# Patient Record
Sex: Male | Born: 1935 | Race: White | Hispanic: Yes | Smoking: Never smoker
Health system: Southern US, Community
[De-identification: ages and names within clinical notes are randomized; demographics above are authoritative.]

## PROBLEM LIST (undated history)

## (undated) DIAGNOSIS — J449 Chronic obstructive pulmonary disease, unspecified: Secondary | ICD-10-CM

---

## 2017-04-03 ENCOUNTER — Encounter: Payer: Self-pay | Admitting: Emergency Medicine

## 2017-04-03 ENCOUNTER — Inpatient Hospital Stay
Admission: EM | Admit: 2017-04-03 | Discharge: 2017-04-07 | DRG: 871 | Disposition: A | Discharge status: Skilled Nursing Facility | Payer: Medicare Other | Attending: Internal Medicine | Admitting: Internal Medicine

## 2017-04-03 ENCOUNTER — Emergency Department: Payer: Medicare Other

## 2017-04-03 DIAGNOSIS — J9621 Acute and chronic respiratory failure with hypoxia: Secondary | ICD-10-CM

## 2017-04-03 DIAGNOSIS — J440 Chronic obstructive pulmonary disease with acute lower respiratory infection: Secondary | ICD-10-CM | POA: Diagnosis present

## 2017-04-03 DIAGNOSIS — A419 Sepsis, unspecified organism: Secondary | ICD-10-CM | POA: Diagnosis present

## 2017-04-03 DIAGNOSIS — R000 Tachycardia, unspecified: Secondary | ICD-10-CM | POA: Diagnosis present

## 2017-04-03 DIAGNOSIS — D638 Anemia in other chronic diseases classified elsewhere: Secondary | ICD-10-CM | POA: Diagnosis present

## 2017-04-03 DIAGNOSIS — J9620 Acute and chronic respiratory failure, unspecified whether with hypoxia or hypercapnia: Secondary | ICD-10-CM | POA: Diagnosis not present

## 2017-04-03 DIAGNOSIS — J441 Chronic obstructive pulmonary disease with (acute) exacerbation: Secondary | ICD-10-CM

## 2017-04-03 DIAGNOSIS — Z9981 Dependence on supplemental oxygen: Secondary | ICD-10-CM | POA: Diagnosis not present

## 2017-04-03 DIAGNOSIS — Z79899 Other long term (current) drug therapy: Secondary | ICD-10-CM

## 2017-04-03 DIAGNOSIS — E860 Dehydration: Secondary | ICD-10-CM | POA: Diagnosis present

## 2017-04-03 DIAGNOSIS — J189 Pneumonia, unspecified organism: Secondary | ICD-10-CM | POA: Diagnosis present

## 2017-04-03 DIAGNOSIS — Z66 Do not resuscitate: Secondary | ICD-10-CM | POA: Diagnosis present

## 2017-04-03 DIAGNOSIS — J9600 Acute respiratory failure, unspecified whether with hypoxia or hypercapnia: Secondary | ICD-10-CM

## 2017-04-03 DIAGNOSIS — I248 Other forms of acute ischemic heart disease: Secondary | ICD-10-CM | POA: Diagnosis present

## 2017-04-03 HISTORY — DX: Chronic obstructive pulmonary disease, unspecified: J44.9

## 2017-04-03 LAB — TROPONIN I: TROPONIN I: 0.06 ng/mL — AB (ref <0.03)

## 2017-04-03 LAB — CBC WITH DIFFERENTIAL/PLATELET
BASOS ABS: 0 10*3/uL (ref 0–0.1)
BASOS PCT: 0 %
Eosinophils Absolute: 0 10*3/uL (ref 0–0.7)
Eosinophils Relative: 0 %
HEMATOCRIT: 36.2 % — AB (ref 40.0–52.0)
HEMOGLOBIN: 12.3 g/dL — AB (ref 13.0–18.0)
Lymphocytes Relative: 2 %
Lymphs Abs: 0.5 10*3/uL — ABNORMAL LOW (ref 1.0–3.6)
MCH: 30.6 pg (ref 26.0–34.0)
MCHC: 33.9 g/dL (ref 32.0–36.0)
MCV: 90 fL (ref 80.0–100.0)
Monocytes Absolute: 1.4 10*3/uL — ABNORMAL HIGH (ref 0.2–1.0)
Monocytes Relative: 6 %
NEUTROS ABS: 19.9 10*3/uL — AB (ref 1.4–6.5)
NEUTROS PCT: 92 %
Platelets: 165 10*3/uL (ref 150–440)
RBC: 4.02 MIL/uL — ABNORMAL LOW (ref 4.40–5.90)
RDW: 14.3 % (ref 11.5–14.5)
WBC: 21.8 10*3/uL — ABNORMAL HIGH (ref 3.8–10.6)

## 2017-04-03 LAB — BLOOD GAS, VENOUS
Acid-Base Excess: 0.3 mmol/L (ref 0.0–2.0)
BICARBONATE: 26.9 mmol/L (ref 20.0–28.0)
DELIVERY SYSTEMS: POSITIVE
FIO2: 0.4
O2 Saturation: 82 %
PATIENT TEMPERATURE: 37
PH VEN: 7.33 (ref 7.250–7.430)
PO2 VEN: 50 mmHg — AB (ref 32.0–45.0)
pCO2, Ven: 51 mmHg (ref 44.0–60.0)

## 2017-04-03 LAB — MRSA PCR SCREENING: MRSA BY PCR: NEGATIVE

## 2017-04-03 LAB — URINALYSIS, ROUTINE W REFLEX MICROSCOPIC
Bilirubin Urine: NEGATIVE
Glucose, UA: NEGATIVE mg/dL
HGB URINE DIPSTICK: NEGATIVE
Ketones, ur: NEGATIVE mg/dL
Leukocytes, UA: NEGATIVE
Nitrite: NEGATIVE
PROTEIN: NEGATIVE mg/dL
Specific Gravity, Urine: 1.023 (ref 1.005–1.030)
pH: 5 (ref 5.0–8.0)

## 2017-04-03 LAB — COMPREHENSIVE METABOLIC PANEL
ALBUMIN: 3.2 g/dL — AB (ref 3.5–5.0)
ALT: 16 U/L — ABNORMAL LOW (ref 17–63)
ANION GAP: 7 (ref 5–15)
AST: 19 U/L (ref 15–41)
Alkaline Phosphatase: 108 U/L (ref 38–126)
BILIRUBIN TOTAL: 1.6 mg/dL — AB (ref 0.3–1.2)
BUN: 21 mg/dL — ABNORMAL HIGH (ref 6–20)
CO2: 26 mmol/L (ref 22–32)
Calcium: 8.6 mg/dL — ABNORMAL LOW (ref 8.9–10.3)
Chloride: 101 mmol/L (ref 101–111)
Creatinine, Ser: 1.03 mg/dL (ref 0.61–1.24)
GFR calc Af Amer: >60 mL/min (ref >60)
Glucose, Bld: 144 mg/dL — ABNORMAL HIGH (ref 65–99)
POTASSIUM: 4.4 mmol/L (ref 3.5–5.1)
Sodium: 134 mmol/L — ABNORMAL LOW (ref 135–145)
TOTAL PROTEIN: 7.1 g/dL (ref 6.5–8.1)

## 2017-04-03 LAB — CREATININE, SERUM
Creatinine, Ser: 0.92 mg/dL (ref 0.61–1.24)
GFR calc Af Amer: >60 mL/min (ref >60)
GFR calc non Af Amer: >60 mL/min (ref >60)

## 2017-04-03 LAB — MAGNESIUM: Magnesium: 2.1 mg/dL (ref 1.7–2.4)

## 2017-04-03 LAB — PROCALCITONIN: PROCALCITONIN: 0.92 ng/mL

## 2017-04-03 LAB — PROTIME-INR
INR: 1.31
Prothrombin Time: 16.4 seconds — ABNORMAL HIGH (ref 11.4–15.2)

## 2017-04-03 LAB — GLUCOSE, CAPILLARY: Glucose-Capillary: 164 mg/dL — ABNORMAL HIGH (ref 65–99)

## 2017-04-03 LAB — LACTIC ACID, PLASMA: LACTIC ACID, VENOUS: 1.3 mmol/L (ref 0.5–1.9)

## 2017-04-03 MED ORDER — SODIUM CHLORIDE 0.9 % IV SOLN
Freq: Once | INTRAVENOUS | Status: AC
  Administered 2017-04-03: 13:00:00 via INTRAVENOUS

## 2017-04-03 MED ORDER — PIPERACILLIN-TAZOBACTAM 3.375 G IVPB 30 MIN
INTRAVENOUS | Status: AC
  Administered 2017-04-03: 3.375 g via INTRAVENOUS
  Filled 2017-04-03: qty 50

## 2017-04-03 MED ORDER — LEVALBUTEROL HCL 1.25 MG/0.5ML IN NEBU
1.2500 mg | INHALATION_SOLUTION | Freq: Four times a day (QID) | RESPIRATORY_TRACT | Status: DC
  Filled 2017-04-03: qty 0.5

## 2017-04-03 MED ORDER — SODIUM CHLORIDE 0.9% FLUSH
3.0000 mL | INTRAVENOUS | Status: DC | PRN
  Administered 2017-04-05: 3 mL via INTRAVENOUS
  Filled 2017-04-03: qty 3

## 2017-04-03 MED ORDER — IPRATROPIUM-ALBUTEROL 0.5-2.5 (3) MG/3ML IN SOLN
3.0000 mL | Freq: Once | RESPIRATORY_TRACT | Status: AC
  Administered 2017-04-03: 3 mL via RESPIRATORY_TRACT
  Filled 2017-04-03: qty 6

## 2017-04-03 MED ORDER — IPRATROPIUM-ALBUTEROL 0.5-2.5 (3) MG/3ML IN SOLN
3.0000 mL | RESPIRATORY_TRACT | Status: DC
  Administered 2017-04-03 – 2017-04-07 (×21): 3 mL via RESPIRATORY_TRACT
  Filled 2017-04-03 (×21): qty 3

## 2017-04-03 MED ORDER — PIPERACILLIN-TAZOBACTAM 3.375 G IVPB
3.3750 g | Freq: Three times a day (TID) | INTRAVENOUS | Status: DC
  Administered 2017-04-03 – 2017-04-06 (×7): 3.375 g via INTRAVENOUS
  Filled 2017-04-03 (×9): qty 50

## 2017-04-03 MED ORDER — PIPERACILLIN-TAZOBACTAM 3.375 G IVPB 30 MIN
3.3750 g | Freq: Once | INTRAVENOUS | Status: AC
Start: 2017-04-03 — End: 2017-04-03
  Administered 2017-04-03: 3.375 g via INTRAVENOUS

## 2017-04-03 MED ORDER — THEOPHYLLINE ER 300 MG PO TB12
150.0000 mg | ORAL_TABLET | Freq: Two times a day (BID) | ORAL | Status: DC
  Administered 2017-04-04 – 2017-04-07 (×7): 150 mg via ORAL
  Filled 2017-04-03 (×9): qty 1

## 2017-04-03 MED ORDER — TERAZOSIN HCL 5 MG PO CAPS
10.0000 mg | ORAL_CAPSULE | Freq: Every day | ORAL | Status: DC
  Administered 2017-04-03 – 2017-04-06 (×4): 10 mg via ORAL
  Filled 2017-04-03 (×5): qty 2

## 2017-04-03 MED ORDER — ALPRAZOLAM 0.5 MG PO TABS
0.2500 mg | ORAL_TABLET | Freq: Three times a day (TID) | ORAL | Status: DC | PRN
  Administered 2017-04-05 – 2017-04-06 (×4): 0.25 mg via ORAL
  Filled 2017-04-03 (×5): qty 1

## 2017-04-03 MED ORDER — BISACODYL 5 MG PO TBEC: 5.0000 mg | DELAYED_RELEASE_TABLET | Freq: Every day | ORAL | Status: DC | PRN

## 2017-04-03 MED ORDER — METHYLPREDNISOLONE SODIUM SUCC 40 MG IJ SOLR
40.0000 mg | Freq: Two times a day (BID) | INTRAMUSCULAR | Status: DC
  Administered 2017-04-03 – 2017-04-07 (×8): 40 mg via INTRAVENOUS
  Filled 2017-04-03 (×8): qty 1

## 2017-04-03 MED ORDER — SODIUM CHLORIDE 0.9 % IV BOLUS (SEPSIS)
1000.0000 mL | Freq: Once | INTRAVENOUS | Status: AC
  Administered 2017-04-03: 1000 mL via INTRAVENOUS

## 2017-04-03 MED ORDER — HYDROCODONE-ACETAMINOPHEN 5-325 MG PO TABS: 1.0000 | ORAL_TABLET | ORAL | Status: DC | PRN

## 2017-04-03 MED ORDER — IPRATROPIUM-ALBUTEROL 0.5-2.5 (3) MG/3ML IN SOLN
3.0000 mL | Freq: Once | RESPIRATORY_TRACT | Status: AC
  Administered 2017-04-03: 3 mL via RESPIRATORY_TRACT

## 2017-04-03 MED ORDER — ALBUTEROL SULFATE (2.5 MG/3ML) 0.083% IN NEBU: 2.5000 mg | INHALATION_SOLUTION | RESPIRATORY_TRACT | Status: DC | PRN

## 2017-04-03 MED ORDER — MOMETASONE FUROATE 220 MCG/INH IN AEPB
2.0000 | INHALATION_SPRAY | Freq: Every day | RESPIRATORY_TRACT | Status: DC
Start: 2017-04-03 — End: 2017-04-03

## 2017-04-03 MED ORDER — BUDESONIDE 0.5 MG/2ML IN SUSP
0.5000 mg | Freq: Two times a day (BID) | RESPIRATORY_TRACT | Status: DC
  Administered 2017-04-03 – 2017-04-07 (×8): 0.5 mg via RESPIRATORY_TRACT
  Filled 2017-04-03 (×8): qty 2

## 2017-04-03 MED ORDER — SODIUM CHLORIDE 0.9 % IV SOLN
250.0000 mL | INTRAVENOUS | Status: DC | PRN
  Administered 2017-04-05: 250 mL via INTRAVENOUS

## 2017-04-03 MED ORDER — SENNOSIDES-DOCUSATE SODIUM 8.6-50 MG PO TABS: 1.0000 | ORAL_TABLET | Freq: Every evening | ORAL | Status: DC | PRN

## 2017-04-03 MED ORDER — ACETAMINOPHEN 325 MG PO TABS: 650.0000 mg | ORAL_TABLET | Freq: Four times a day (QID) | ORAL | Status: DC | PRN

## 2017-04-03 MED ORDER — TIOTROPIUM BROMIDE MONOHYDRATE 18 MCG IN CAPS
18.0000 ug | ORAL_CAPSULE | Freq: Every day | RESPIRATORY_TRACT | Status: DC
  Administered 2017-04-04 – 2017-04-06 (×3): 18 ug via RESPIRATORY_TRACT
  Filled 2017-04-03: qty 5

## 2017-04-03 MED ORDER — MORPHINE SULFATE 10 MG/5ML PO SOLN
5.0000 mg | Freq: Four times a day (QID) | ORAL | Status: DC | PRN
  Administered 2017-04-05 – 2017-04-06 (×5): 5 mg via ORAL
  Filled 2017-04-03 (×5): qty 5

## 2017-04-03 MED ORDER — ARFORMOTEROL TARTRATE 15 MCG/2ML IN NEBU
15.0000 ug | INHALATION_SOLUTION | Freq: Two times a day (BID) | RESPIRATORY_TRACT | Status: DC
  Administered 2017-04-03 – 2017-04-07 (×7): 15 ug via RESPIRATORY_TRACT
  Filled 2017-04-03 (×9): qty 2

## 2017-04-03 MED ORDER — ONDANSETRON HCL 4 MG/2ML IJ SOLN: 4.0000 mg | Freq: Four times a day (QID) | INTRAMUSCULAR | Status: DC | PRN

## 2017-04-03 MED ORDER — VITAMIN B-12 1000 MCG PO TABS
1000.0000 ug | ORAL_TABLET | Freq: Every day | ORAL | Status: DC
  Administered 2017-04-04 – 2017-04-07 (×4): 1000 ug via ORAL
  Filled 2017-04-03 (×4): qty 1

## 2017-04-03 MED ORDER — ACETAMINOPHEN 650 MG RE SUPP: 650.0000 mg | Freq: Four times a day (QID) | RECTAL | Status: DC | PRN

## 2017-04-03 MED ORDER — TIOTROPIUM BROMIDE-OLODATEROL 2.5-2.5 MCG/ACT IN AERS: INHALATION_SPRAY | Freq: Every day | RESPIRATORY_TRACT | Status: DC

## 2017-04-03 MED ORDER — SENNOSIDES-DOCUSATE SODIUM 8.6-50 MG PO TABS
1.0000 | ORAL_TABLET | Freq: Two times a day (BID) | ORAL | Status: DC
  Administered 2017-04-03 – 2017-04-07 (×7): 1 via ORAL
  Filled 2017-04-03 (×7): qty 1

## 2017-04-03 MED ORDER — HEPARIN SODIUM (PORCINE) 5000 UNIT/ML IJ SOLN
5000.0000 [IU] | Freq: Three times a day (TID) | INTRAMUSCULAR | Status: DC
  Administered 2017-04-03 – 2017-04-07 (×12): 5000 [IU] via SUBCUTANEOUS
  Filled 2017-04-03 (×13): qty 1

## 2017-04-03 MED ORDER — FLUOXETINE HCL 20 MG PO CAPS
40.0000 mg | ORAL_CAPSULE | Freq: Every day | ORAL | Status: DC
  Administered 2017-04-04 – 2017-04-07 (×4): 40 mg via ORAL
  Filled 2017-04-03 (×5): qty 2

## 2017-04-03 MED ORDER — MORPHINE SULFATE 20 MG/5ML PO SOLN: 5.0000 mg | Freq: Four times a day (QID) | ORAL | Status: DC

## 2017-04-03 MED ORDER — FINASTERIDE 5 MG PO TABS
5.0000 mg | ORAL_TABLET | Freq: Every day | ORAL | Status: DC
  Administered 2017-04-04 – 2017-04-07 (×4): 5 mg via ORAL
  Filled 2017-04-03 (×4): qty 1

## 2017-04-03 MED ORDER — VANCOMYCIN HCL IN DEXTROSE 1-5 GM/200ML-% IV SOLN
INTRAVENOUS | Status: AC
  Administered 2017-04-03: 1000 mg via INTRAVENOUS
  Filled 2017-04-03: qty 200

## 2017-04-03 MED ORDER — VANCOMYCIN HCL IN DEXTROSE 1-5 GM/200ML-% IV SOLN
1000.0000 mg | INTRAVENOUS | Status: DC
  Administered 2017-04-03: 1000 mg via INTRAVENOUS
  Filled 2017-04-03 (×2): qty 200

## 2017-04-03 MED ORDER — HYDROXYZINE HCL 10 MG PO TABS
10.0000 mg | ORAL_TABLET | Freq: Four times a day (QID) | ORAL | Status: DC | PRN
  Administered 2017-04-06: 10 mg via ORAL
  Filled 2017-04-03 (×2): qty 1

## 2017-04-03 MED ORDER — SODIUM CHLORIDE 0.9% FLUSH
3.0000 mL | Freq: Two times a day (BID) | INTRAVENOUS | Status: DC
  Administered 2017-04-03 – 2017-04-07 (×7): 3 mL via INTRAVENOUS

## 2017-04-03 MED ORDER — METHYLPREDNISOLONE SODIUM SUCC 125 MG IJ SOLR: 60.0000 mg | Freq: Four times a day (QID) | INTRAMUSCULAR | Status: DC

## 2017-04-03 MED ORDER — VANCOMYCIN HCL IN DEXTROSE 1-5 GM/200ML-% IV SOLN
1000.0000 mg | Freq: Once | INTRAVENOUS | Status: AC
  Administered 2017-04-03: 1000 mg via INTRAVENOUS

## 2017-04-03 MED ORDER — NORTRIPTYLINE HCL 10 MG PO CAPS
20.0000 mg | ORAL_CAPSULE | Freq: Every day | ORAL | Status: DC
  Administered 2017-04-03 – 2017-04-06 (×4): 20 mg via ORAL
  Filled 2017-04-03 (×5): qty 2

## 2017-04-03 MED ORDER — ONDANSETRON HCL 4 MG PO TABS: 4.0000 mg | ORAL_TABLET | Freq: Four times a day (QID) | ORAL | Status: DC | PRN

## 2017-04-03 MED ORDER — PRIMIDONE 50 MG PO TABS
50.0000 mg | ORAL_TABLET | Freq: Two times a day (BID) | ORAL | Status: DC
  Administered 2017-04-03 – 2017-04-07 (×8): 50 mg via ORAL
  Filled 2017-04-03 (×9): qty 1

## 2017-04-03 NOTE — Progress Notes (Signed)
Patient on 3 liter nasal cannula tolerating well. bipap on standby

## 2017-04-03 NOTE — ED Notes (Signed)
[facility]  [facility]

## 2017-04-03 NOTE — ED Triage Notes (Signed)
Patient presents to ED via [facility] from [facility] with c/o SOB. Patient with auditory wheezes. End stage COPD, hospice care. Full code at this time. EMS report temp of 100, HR 120. EMS gave 1 duoneb treatment and 125 of solumedrol. Patient wears 3L [place] chronically. SpO2 91% on 3L.

## 2017-04-03 NOTE — H&P (Addendum)
[employer] - [place] at [facility]   PATIENT NAME: Johnny Cisneros    MR#:  [id number]  DATE OF BIRTH:  December 20, 1930  DATE OF ADMISSION:  04/03/2017  PRIMARY CARE PHYSICIAN: No primary care provider on file.   REQUESTING/REFERRING PHYSICIAN: Emily Filbert, MD  CHIEF COMPLAINT:   Chief Complaint  Patient presents with  . Code Sepsis   Shortness of breath, wheezing and cough for 3 days. HISTORY OF PRESENT ILLNESS:  Johnny Cisneros  is a 83 y.o. male with a known history of End-stage COPD, chronic respiratory failure on home oxygen 3 L and on hospice care at [facility]. The patient presently ED with the above chief complaint. She was found hypoxia, tachycardia and tachypnea, as well as leukocytosis. Chest x-ray didn't show any infiltrate. He is put on BiPAP. He is treated with DuoNeb and IV Solu-Medrol with mild improvement.  PAST MEDICAL HISTORY:   Past Medical History:  Diagnosis Date  . COPD (chronic obstructive pulmonary disease) (HCC)     PAST SURGICAL HISTORY:  History reviewed. No pertinent surgical history.  SOCIAL HISTORY:   Social History  Substance Use Topics  . Smoking status: Never Smoker  . Smokeless tobacco: Never Used  . Alcohol use No    FAMILY HISTORY:  No family history on file. The patient doesn't know his family history.  DRUG ALLERGIES:  No Known Allergies  REVIEW OF SYSTEMS:   Review of Systems  Constitutional: Positive for malaise/fatigue. Negative for chills and fever.  HENT: Negative for sore throat.   Eyes: Negative for blurred vision and double vision.  Respiratory: Positive for cough, sputum production, shortness of breath and wheezing. Negative for hemoptysis and stridor.   Cardiovascular: Negative for chest pain, palpitations, orthopnea and leg swelling.  Gastrointestinal: Negative for abdominal pain, blood in stool, diarrhea, melena, nausea and vomiting.  Genitourinary: Negative for dysuria, flank pain  and hematuria.  Musculoskeletal: Negative for back pain and joint pain.  Neurological: Negative for dizziness, sensory change, focal weakness, seizures, loss of consciousness, weakness and headaches.  Endo/Heme/Allergies: Negative for polydipsia.  Psychiatric/Behavioral: Negative for depression. The patient is not nervous/anxious.     MEDICATIONS AT HOME:   Prior to Admission medications   Medication Sig Start Date End Date Taking? Authorizing Provider  albuterol (PROVENTIL) (2.5 MG/3ML) 0.083% nebulizer solution Take 2.5 mg by nebulization every 6 (six) hours as needed for wheezing or shortness of breath.   Yes [provider]  ALPRAZolam (XANAX) 0.25 MG tablet Take 0.25 mg by mouth 3 (three) times daily as needed for anxiety.   Yes [provider]  ammonium lactate (LAC-HYDRIN) 12 % lotion Apply 1 application topically 2 (two) times daily.   Yes [provider]  finasteride (PROSCAR) 5 MG tablet Take 5 mg by mouth daily.   Yes [provider]  FLUoxetine (PROZAC) 40 MG capsule Take 40 mg by mouth daily.   Yes [provider]  hydrOXYzine (ATARAX/VISTARIL) 10 MG tablet Take 10 mg by mouth 4 (four) times daily as needed for itching.   Yes [provider]  mometasone (ASMANEX) 220 MCG/INH inhaler Inhale 2 puffs into the lungs at bedtime.   Yes [provider]  morphine 20 MG/5ML solution Take 5 mg by mouth 4 (four) times daily. For breathing.   Yes [provider]  nortriptyline (PAMELOR) 10 MG capsule Take 20 mg by mouth at bedtime.   Yes [provider]  primidone (MYSOLINE) 50 MG tablet Take 50  mg by mouth 2 (two) times daily.   Yes [provider]  sennosides-docusate sodium (SENOKOT-S) 8.6-50 MG tablet Take 1 tablet by mouth 2 (two) times daily.   Yes [provider]  terazosin (HYTRIN) 10 MG capsule Take 10 mg by mouth at bedtime.   Yes [provider]  theophylline (THEO-24) 300 MG  24 hr capsule Take 150 mg by mouth 2 (two) times daily.   Yes [provider]  Tiotropium Bromide-Olodaterol (STIOLTO RESPIMAT IN) Inhale 2 puffs into the lungs daily.   Yes [provider]  vitamin B-12 (CYANOCOBALAMIN) 1000 MCG tablet Take 1,000 mcg by mouth daily.   Yes [provider]      VITAL SIGNS:  Blood pressure 126/68, pulse (!) 101, temperature 98.8 F (37.1 C), resp. rate (!) 31, height 5\' 7"  (1.702 m), weight 139 lb (63 kg), SpO2 96 %.  PHYSICAL EXAMINATION:  Physical Exam  GENERAL:  83 y.o.-year-old patient lying in the bed with BIPAP. EYES: Pupils equal, round, reactive to light and accommodation. No scleral icterus. Extraocular muscles intact.  HEENT: Head atraumatic, normocephalic.  NECK:  Supple, no jugular venous distention. No thyroid enlargement, no tenderness.  LUNGS: Very diminished breath sounds bilaterally, no wheezing, rales,rhonchi or crepitation. No use of accessory muscles of respiration.  CARDIOVASCULAR: S1, S2 normal. No murmurs, rubs, or gallops.  ABDOMEN: Soft, nontender, nondistended. Bowel sounds present. No organomegaly or mass.  EXTREMITIES: No pedal edema, cyanosis, or clubbing.  NEUROLOGIC: Cranial nerves II through XII are intact. Muscle strength 5/5 in all extremities. Sensation intact. Gait not checked.  PSYCHIATRIC: The patient is alert and oriented x 3.  SKIN: No obvious rash, lesion, or ulcer.   LABORATORY PANEL:   CBC  Recent Labs Lab 04/03/17 1202  WBC 21.8*  HGB 12.3*  HCT 36.2*  PLT 165   ------------------------------------------------------------------------------------------------------------------  Chemistries   Recent Labs Lab 04/03/17 1202  NA 134*  K 4.4  CL 101  CO2 26  GLUCOSE 144*  BUN 21*  CREATININE 1.03  CALCIUM 8.6*  AST 19  ALT 16*  ALKPHOS 108  BILITOT 1.6*    ------------------------------------------------------------------------------------------------------------------  Cardiac Enzymes  Recent Labs Lab 04/03/17 1206  TROPONINI 0.06*   ------------------------------------------------------------------------------------------------------------------  RADIOLOGY:  Dg Chest Port 1 View  Result Date: 04/03/2017 CLINICAL DATA:  Shortness of breath.  Wheezing. EXAM: PORTABLE CHEST 1 VIEW COMPARISON:  No prior . FINDINGS: Mediastinum hilar structures are normal. Heart size normal. Bilateral interstitial prominence noted. These changes could be chronic. Active interstitial process including pneumonitis and interstitial edema cannot be excluded. COPD . Bibasilar atelectasis . No pleural effusion or pneumothorax. IMPRESSION: 1. Bilateral interstitial prominence, these changes may be chronic. An active interstitial process such as interstitial pneumonitis or interstitial edema cannot be excluded. 2. COPD. Bibasilar atelectasis. Follow-up chest x-ray may prove useful for continued evaluation. Electronically Signed   By: Maisie Fus  Register   On: 04/03/2017 12:21      IMPRESSION AND PLAN:   Acute on chronic respiratory failure with hypoxia due to COPD exacerbation.. The patient will be admitted to stepdown unit. Continue BiPAP. Xopenex every 6 hours, IV Solu-Medrol every 6 hours. Intensivist consult.  Sepsis. Unclear etiology. Continue IV antibiotics and follow-up CBC and cultures. Elevated troponin. Possible due to demanding ischemia, follow-up troponin level. Aspirin. Dehydration. IV fluid support.  Discussed with ICU nurse practitioner.  All the records are reviewed and case discussed with ED provider. Management plans discussed with the patient, family and they are in agreement.  CODE STATUS: Full code  TOTAL CRITICAL TIME TAKING CARE OF THIS PATIENT: 60 minutes.    Shaune Pollackhen, Rozann Holts M.D on 04/03/2017 at 3:00 PM  Between 7am to 6pm - Pager -  [phone]  After 6pm go to [email or web address] - Scientist, research (life sciences)password [email or web address] [email or web address]  [facility] [place] [facility]  Office  [phone]  CC: Primary care physician; No primary care provider on file.   Note: This dictation was prepared with Dragon dictation along with smaller phrase technology. Any transcriptional errors that result from this process are unintentional.

## 2017-04-03 NOTE — ED Provider Notes (Signed)
[facility] Emergency Department Provider Note       Time seen: ----------------------------------------- 12:12 PM on 04/03/2017 -----------------------------------------     I have reviewed the triage vital signs and the nursing notes.   HISTORY   Chief Complaint Code Sepsis    HPI Johnny Cisneros is a 83 y.o. male who presents to the ED for shortness of breath. Patient arrives via EMS from [facility] with difficulty breathing and fever with cough and congestion. Patient presents with wheezing, report he has end-stage COPD and is on hospice care. He currently is a full code. EMS reported a temperature around the 100. He presents tachypnea can tachycardic. He received DuoNeb and Solu-Medrol prior to arrival with mild improvement. He currently wears 3 L of nasal cannula oxygen all the time.    Past Medical History:  Diagnosis Date  . COPD (chronic obstructive pulmonary disease) (HCC)     There are no active problems to display for this patient.   History reviewed. No pertinent surgical history.  Allergies Patient has no known allergies.  Social History Social History  Substance Use Topics  . Smoking status: Never Smoker  . Smokeless tobacco: Never Used  . Alcohol use No    Review of Systems Constitutional: Positive for fever Eyes: Negative for vision changes ENT:  Negative for congestion, sore throat Cardiovascular: Negative for chest pain. Respiratory: Positive for cough and shortness of breath Gastrointestinal: Negative for abdominal pain, vomiting and diarrhea. Genitourinary: Negative for dysuria. Musculoskeletal: Negative for back pain. Skin: Negative for rash. Neurological: Negative for headaches, focal weakness or numbness.  All systems negative/normal/unremarkable except as stated in the HPI  ____________________________________________   PHYSICAL EXAM:  VITAL SIGNS: ED Triage Vitals  Enc Vitals Group     BP --      Pulse --      Resp --      Temp 04/03/17 1159 98.8 F (37.1 C)     Temp src --      SpO2 04/03/17 1207 90 %     Weight 04/03/17 1200 139 lb (63 kg)     Height 04/03/17 1200 5\' 7"  (1.702 m)     Head Circumference --      Peak Flow --      Pain Score --      Pain Loc --      Pain Edu? --      Excl. in GC? --     Constitutional: Alert and oriented. Mild to moderate distress Eyes: Conjunctivae are normal. Normal extraocular movements. ENT   Head: Normocephalic and atraumatic.   Nose: No congestion/rhinnorhea.   Mouth/Throat: Mucous membranes are moist.   Neck: No stridor. Cardiovascular: Rapid rate, regular rhythm. No murmurs, rubs, or gallops. Respiratory: Tachypnea with wheezing bilaterally, prolonged expirations Gastrointestinal: Soft and nontender. Normal bowel sounds Musculoskeletal: Nontender with normal range of motion in extremities. Minimal edema is noted Neurologic:  Normal speech and language. No gross focal neurologic deficits are appreciated.  Skin:  Skin is warm, dry and intact. No rash noted. Psychiatric: Mood and affect are normal. Speech and behavior are normal.  ____________________________________________  EKG: Interpreted by me. Sinus tachycardia with rate 120 bpm, normal PR interval, normal QRS width, normal QT, nonspecific ST changes.  Repeat EKG interpreted by me, sinus tachycardia with a rate of 118 bpm, normal PR interval, normal QRS width, normal QT, ST segment changes   ____________________________________________  ED COURSE:  Pertinent labs & imaging results that were available during  my care of the patient were reviewed by me and considered in my medical decision making (see chart for details). Patient presents for dyspnea, we will assess with labs and imaging as indicated.Patient arrives critically ill  Clinical Course as of Apr 03 1245  Fri Apr 03, 2017  1238 Patient presents not improvement after 2 DuoNeb. We will place him on  BiPAP.  [JW]    Clinical Course User Index [JW] Emily FilbertWilliams, Jonathan E, MD   Procedures ____________________________________________   LABS (pertinent positives/negatives)  Labs Reviewed  COMPREHENSIVE METABOLIC PANEL - Abnormal; Notable for the following:       Result Value   Sodium 134 (*)    Glucose, Bld 144 (*)    BUN 21 (*)    Calcium 8.6 (*)    Albumin 3.2 (*)    ALT 16 (*)    Total Bilirubin 1.6 (*)    All other components within normal limits  CBC WITH DIFFERENTIAL/PLATELET - Abnormal; Notable for the following:    WBC 21.8 (*)    RBC 4.02 (*)    Hemoglobin 12.3 (*)    HCT 36.2 (*)    Neutro Abs 19.9 (*)    Lymphs Abs 0.5 (*)    Monocytes Absolute 1.4 (*)    All other components within normal limits  BLOOD GAS, VENOUS - Abnormal; Notable for the following:    pO2, Ven 50.0 (*)    All other components within normal limits  CULTURE, BLOOD (ROUTINE X 2)  CULTURE, BLOOD (ROUTINE X 2)  URINE CULTURE  LACTIC ACID, PLASMA  PROTIME-INR  URINALYSIS, ROUTINE W REFLEX MICROSCOPIC   CRITICAL CARE Performed by: Emily FilbertWilliams, Jonathan E   Total critical care time: 30 minutes  Critical care time was exclusive of separately billable procedures and treating other patients.  Critical care was necessary to treat or prevent imminent or life-threatening deterioration.  Critical care was time spent personally by me on the following activities: development of treatment plan with patient and/or surrogate as well as nursing, discussions with consultants, evaluation of patient's response to treatment, examination of patient, obtaining history from patient or surrogate, ordering and performing treatments and interventions, ordering and review of laboratory studies, ordering and review of radiographic studies, pulse oximetry and re-evaluation of patient's condition.  RADIOLOGY Images were viewed by me  Chest x-ray  IMPRESSION: 1. Bilateral interstitial prominence, these changes may be  chronic. An active interstitial process such as interstitial pneumonitis or interstitial edema cannot be excluded.  2. COPD. Bibasilar atelectasis. Follow-up chest x-ray may prove useful for continued evaluation. ____________________________________________  FINAL ASSESSMENT AND PLAN  Acute respiratory distress, COPD exacerbation, Possible pneumonia    Plan: Patient's labs and imaging were dictated above. Patient had presented for respiratory distress and did not improve after multiple DuoNeb since after artery having received a DuoNeb and steroids in route. He does have end-stage COPD and is much improved on BiPAP. I have covered for sepsis with vancomycin and Zosyn due to his high white count. I will discuss with the hospitalist for admission.   Emily FilbertWilliams, Jonathan E, MD   Note: This note was generated in part or whole with voice recognition software. Voice recognition is usually quite accurate but there are transcription errors that can and very often do occur. I apologize for any typographical errors that were not detected and corrected.     Emily FilbertWilliams, Jonathan E, MD 04/03/17 1325

## 2017-04-03 NOTE — Progress Notes (Signed)
Pharmacy Antibiotic Note  Johnny Cisneros is a 83 y.o. male admitted on 04/03/2017 with sepsis.  Pharmacy has been consulted for vancomycin and piperacillin/tazobactam dosing.  Plan: Piperacillin/tazobactam 3.375 g IV q8h EI  Vancomycin 1000 mg dose given in ED.  Will order vancomycin 1000 mg IV q24h (7 hour stacked dose) Goal VT 15-20 mcg/mL VT 7/23 @ 1930  Kinetics: Using actual body weight = 63 kg, CrCl = 49 mL/min Ke: 0.045 Half-life: 15 hrs Vd: = 44 L Cmin (estimate) = 15 mcg/mL  Height: 5\' 7"  (170.2 cm) Weight: 139 lb (63 kg) IBW/kg (Calculated) : 66.1  Temp (24hrs), Avg:98.8 F (37.1 C), Min:98.8 F (37.1 C), Max:98.8 F (37.1 C)   Recent Labs Lab 04/03/17 1202  WBC 21.8*  CREATININE 1.03  LATICACIDVEN 1.3    Estimated Creatinine Clearance: 48.5 mL/min (by C-G formula based on SCr of 1.03 mg/dL).    No Known Allergies  Antimicrobials this admission: Piperacillin/tazobactam 7/20 >>  vancomycin 7/20 >>   Dose adjustments this admission:  Microbiology results: 7/20 BCx: Sent 7/20 UCx: Sent  7/20 MRSA PCR: Sent  Thank you for allowing pharmacy to be a part of this patient's care.  Cindi CarbonMary M Haseeb Fiallos, PharmD, BCPS Clinical Pharmacist 04/03/2017 4:56 PM

## 2017-04-03 NOTE — ED Notes (Signed)
Upon entering the room patient has taken off his BiPAP. SpO2 82%. Patient educated on the importance of keeping mask on. Patient verbalizes understanding. Mask reapplied, SpO2 95% on Bipap.

## 2017-04-03 NOTE — ED Notes (Signed)
Informed RN bed ready  1610

## 2017-04-03 NOTE — ED Notes (Signed)
Amy from hospice called and wanted to make sure staff was aware that patient is a hospice patient but is a full code at this time. RN from hospice also requesting his morphine that is ordered (5mg  Q6 hours) be ordered more frequently if patient will be discharged back to facility.

## 2017-04-03 NOTE — Consult Note (Signed)
Name: Johnny Cisneros MRN: [id number] DOB: 11/04/1930    ADMISSION DATE:  04/03/2017 CONSULTATION DATE: 04/03/2017  REFERRING MD : Dr. Imogene Burnhen   CHIEF COMPLAINT: Shortness of Breath   BRIEF PATIENT DESCRIPTION:  83 yo male admitted 07/20 with acute on chronic hypoxic respiratory failure secondary to AECOPD and bilateral pneumonia requiring continuous Bipap   SIGNIFICANT EVENTS  07/20-Pt admitted to the Stepdown Unit   STUDIES:  None   HISTORY OF PRESENT ILLNESS:   This is an 83 yo male with a PMH of End Stage COPD on chronic home O2 @3L  followed by hospice.  [facility] with shortness of breath, fever, cough, and congestion.  Per EMS arrival he was febrile with a temp of 100 F, tachycardic, and tachypneic.  Therefore, en route to the ER he received duoneb treatment and solumedrol with mild improvement of symptoms on 3L O2 via nasal canula.  In the ER pt remained hypoxic with O2 sats 82%, therefore he was placed on continuous Bipap.  [facility] team for further workup and treatment PCCM consulted.  PAST MEDICAL HISTORY :   has a past medical history of COPD (chronic obstructive pulmonary disease) (HCC).  has no past surgical history on file. Prior to Admission medications   Medication Sig Start Date End Date Taking? Authorizing Provider  albuterol (PROVENTIL) (2.5 MG/3ML) 0.083% nebulizer solution Take 2.5 mg by nebulization every 6 (six) hours as needed for wheezing or shortness of breath.   Yes [provider]  ALPRAZolam (XANAX) 0.25 MG tablet Take 0.25 mg by mouth 3 (three) times daily as needed for anxiety.   Yes [provider]  ammonium lactate (LAC-HYDRIN) 12 % lotion Apply 1 application topically 2 (two) times daily.   Yes [provider]  finasteride (PROSCAR) 5 MG tablet Take 5 mg by mouth daily.   Yes [provider]  FLUoxetine (PROZAC)  40 MG capsule Take 40 mg by mouth daily.   Yes [provider]  hydrOXYzine (ATARAX/VISTARIL) 10 MG tablet Take 10 mg by mouth 4 (four) times daily as needed for itching.   Yes [provider]  mometasone (ASMANEX) 220 MCG/INH inhaler Inhale 2 puffs into the lungs at bedtime.   Yes [provider]  morphine 20 MG/5ML solution Take 5 mg by mouth 4 (four) times daily. For breathing.   Yes [provider]  nortriptyline (PAMELOR) 10 MG capsule Take 20 mg by mouth at bedtime.   Yes [provider]  primidone (MYSOLINE) 50 MG tablet Take 50 mg by mouth 2 (two) times daily.   Yes [provider]  sennosides-docusate sodium (SENOKOT-S) 8.6-50 MG tablet Take 1 tablet by mouth 2 (two) times daily.   Yes [provider]  terazosin (HYTRIN) 10 MG capsule Take 10 mg by mouth at bedtime.   Yes [provider]  theophylline (THEO-24) 300 MG 24 hr capsule Take 150 mg by mouth 2 (two) times daily.   Yes [provider]  Tiotropium Bromide-Olodaterol (STIOLTO RESPIMAT IN) Inhale 2 puffs into the lungs daily.   Yes [provider]  vitamin B-12 (CYANOCOBALAMIN) 1000 MCG tablet Take 1,000 mcg by mouth daily.   Yes [provider]   No Known Allergies  FAMILY HISTORY:  family history is not on file. SOCIAL HISTORY:  reports that he has never smoked. He has never used smokeless tobacco. He reports that  he does not drink alcohol or use drugs.  REVIEW OF SYSTEMS: Positives in BOLD  Constitutional: fever, chills, weight loss, malaise/fatigue and diaphoresis.  HENT: hearing loss, ear pain, nosebleeds, congestion, sore throat, neck pain, tinnitus and ear discharge.   Eyes: Negative for blurred vision, double vision, photophobia, pain, discharge and redness.  Respiratory: cough, hemoptysis, sputum production, shortness of breath, wheezing and stridor.   Cardiovascular: Negative for chest pain, palpitations, orthopnea,  claudication, leg swelling and PND.  Gastrointestinal: Negative for heartburn, nausea, vomiting, abdominal pain, diarrhea, constipation, blood in stool and melena.  Genitourinary: Negative for dysuria, urgency, frequency, hematuria and flank pain.  Musculoskeletal: Negative for myalgias, back pain, joint pain and falls.  Skin: Negative for itching and rash.  Neurological: Negative for dizziness, tingling, tremors, sensory change, speech change, focal weakness, seizures, loss of consciousness, weakness and headaches.  Endo/Heme/Allergies: Negative for environmental allergies and polydipsia. Does not bruise/bleed easily.  SUBJECTIVE:  Pt states his breathing has improved since he was placed on Bipap   VITAL SIGNS: Temp:  [98.8 F (37.1 C)] 98.8 F (37.1 C) (07/20 1159) Pulse Rate:  [101-123] 108 (07/20 1530) Resp:  [22-32] 28 (07/20 1530) BP: (120-141)/(51-77) 132/77 (07/20 1530) SpO2:  [90 %-97 %] 96 % (07/20 1530) Weight:  [63 kg (139 lb)] 63 kg (139 lb) (07/20 1200)  PHYSICAL EXAMINATION: General: acutely ill appearing Caucasian male, NAD  Neuro: alert and oriented, follows commands  HEENT: supple, no JVD Cardiovascular: sinus tach, s1s2, no M/R/G Lungs: rhonchi throughout, even, non labored on continuous Bipap Abdomen: +BS x4, soft, non tender, non distended Musculoskeletal: normal bulk and tone, no edema  Skin: intact no rashes or lesions   Recent Labs Lab 04/03/17 1202  NA 134*  K 4.4  CL 101  CO2 26  BUN 21*  CREATININE 1.03  GLUCOSE 144*    Recent Labs Lab 04/03/17 1202  HGB 12.3*  HCT 36.2*  WBC 21.8*  PLT 165   Dg Chest Port 1 View  Result Date: 04/03/2017 CLINICAL DATA:  Shortness of breath.  Wheezing. EXAM: PORTABLE CHEST 1 VIEW COMPARISON:  No prior . FINDINGS: Mediastinum hilar structures are normal. Heart size normal. Bilateral interstitial prominence noted. These changes could be chronic. Active interstitial process including pneumonitis and  interstitial edema cannot be excluded. COPD . Bibasilar atelectasis . No pleural effusion or pneumothorax. IMPRESSION: 1. Bilateral interstitial prominence, these changes may be chronic. An active interstitial process such as interstitial pneumonitis or interstitial edema cannot be excluded. 2. COPD. Bibasilar atelectasis. Follow-up chest x-ray may prove useful for continued evaluation. Electronically Signed   By: Maisie Fus  Register   On: 04/03/2017 12:21    ASSESSMENT / PLAN: Acute on chronic hypoxic respiratory failure secondary to AECOPD and bilateral pneumonia   Leukocytosis  Mildly elevated troponin likely secondary to demand ischemia due to respiratory failure  Anemia without acute blood loss  Hx: End Stage COPD on chronic home O2 @3L  followed by hospice  P: Prn Bipap for dyspnea Maintain O2 sats 88% to 92% Scheduled bronchodilator therapy and nebulized steroids  Prn bronchodilator therapy  IV Steroids  Trend WBC and monitor fever curve Trend PCT Follow cultures Continue abx Trend troponin's Subq heparin for VTE prophylaxis  Trend CBC  Monitor for s/sx of bleeding   Sonda Rumble, AGNP  Pulmonary/Critical Care Pager [phone] (please enter 7 digits) PCCM Consult Pager [phone] (please enter 7 digits)

## 2017-04-04 ENCOUNTER — Inpatient Hospital Stay: Payer: Medicare Other

## 2017-04-04 LAB — BASIC METABOLIC PANEL
ANION GAP: 5 (ref 5–15)
BUN: 19 mg/dL (ref 6–20)
CALCIUM: 8.3 mg/dL — AB (ref 8.9–10.3)
CO2: 25 mmol/L (ref 22–32)
Chloride: 103 mmol/L (ref 101–111)
Creatinine, Ser: 0.82 mg/dL (ref 0.61–1.24)
GFR calc Af Amer: >60 mL/min (ref >60)
GFR calc non Af Amer: >60 mL/min (ref >60)
GLUCOSE: 188 mg/dL — AB (ref 65–99)
Potassium: 4.1 mmol/L (ref 3.5–5.1)
Sodium: 133 mmol/L — ABNORMAL LOW (ref 135–145)

## 2017-04-04 LAB — CBC
HEMATOCRIT: 33.7 % — AB (ref 40.0–52.0)
Hemoglobin: 11.5 g/dL — ABNORMAL LOW (ref 13.0–18.0)
MCH: 31.1 pg (ref 26.0–34.0)
MCHC: 34.1 g/dL (ref 32.0–36.0)
MCV: 91.3 fL (ref 80.0–100.0)
Platelets: 129 10*3/uL — ABNORMAL LOW (ref 150–440)
RBC: 3.69 MIL/uL — ABNORMAL LOW (ref 4.40–5.90)
RDW: 14.3 % (ref 11.5–14.5)
WBC: 12.8 10*3/uL — ABNORMAL HIGH (ref 3.8–10.6)

## 2017-04-04 LAB — TROPONIN I
Troponin I: <0.03 ng/mL (ref <0.03)
Troponin I: <0.03 ng/mL (ref <0.03)

## 2017-04-04 MED ORDER — SIMETHICONE 80 MG PO CHEW
80.0000 mg | CHEWABLE_TABLET | Freq: Four times a day (QID) | ORAL | Status: DC | PRN
  Administered 2017-04-04 – 2017-04-06 (×2): 80 mg via ORAL
  Filled 2017-04-04 (×3): qty 1

## 2017-04-04 NOTE — Progress Notes (Addendum)
[facility] [place] [facility]   PATIENT NAME: Johnny Johnny Cisneros Johnny Cisneros    MR#:  [id number]  DATE OF BIRTH:  08/13/1931  SUBJECTIVE:   Came in with increasing shortness of breath was placed on BiPAP. Now on nasal cannula oxygen. REVIEW OF SYSTEMS:   Review of Systems  Constitutional: Negative for chills, fever and weight loss.  HENT: Negative for ear discharge, ear pain and nosebleeds.   Eyes: Negative for blurred vision, pain and discharge.  Respiratory: Positive for sputum production, shortness of breath and wheezing. Negative for stridor.   Cardiovascular: Negative for chest pain, palpitations, orthopnea and PND.  Gastrointestinal: Negative for abdominal pain, diarrhea, nausea and vomiting.  Genitourinary: Negative for frequency and urgency.  Musculoskeletal: Negative for back pain and joint pain.  Neurological: Positive for weakness. Negative for sensory change, speech change and focal weakness.  Psychiatric/Behavioral: Negative for depression and hallucinations. The patient is not nervous/anxious.    Tolerating Diet: Yes Tolerating PT: Pending  DRUG ALLERGIES:  No Known Allergies  VITALS:  Blood pressure 123/78, pulse 95, temperature (!) 97.4 F (36.3 C), resp. rate 19, height 5\' 7"  (1.702 m), weight 65.4 kg (144 lb 2.9 oz), SpO2 96 %.  PHYSICAL EXAMINATION:   Physical Exam  GENERAL:  83 y.o.-year-old patient lying in the bed with no acute distress. Thin cachectic appears chronically ill EYES: Pupils equal, round, reactive to light and accommodation. No scleral icterus. Extraocular muscles intact.  HEENT: Head atraumatic, normocephalic. Oropharynx and nasopharynx clear.  NECK:  Supple, no jugular venous distention. No thyroid enlargement, no tenderness.  LUNGS: Emphysematous chest distant Normal breath sounds bilaterally, no wheezing, rales, rhonchi. No use of accessory muscles of respiration.  CARDIOVASCULAR: S1, S2 normal. No murmurs, rubs, or  gallops.  ABDOMEN: Soft, nontender, nondistended. Bowel sounds present. No organomegaly or mass.  EXTREMITIES: No cyanosis, clubbing or edema b/l.    NEUROLOGIC: Cranial nerves II through XII are intact. No focal Motor or sensory deficits b/l.   PSYCHIATRIC:  patient is alert and oriented x 2  SKIN: No obvious rash, lesion, or ulcer.   LABORATORY PANEL:  CBC  Recent Labs Lab 04/04/17 0024  WBC 12.8*  HGB 11.5*  HCT 33.7*  PLT 129*    Chemistries   Recent Labs Lab 04/03/17 1202 04/03/17 1646 04/04/17 0024  NA 134*  --  133*  K 4.4  --  4.1  CL 101  --  103  CO2 26  --  25  GLUCOSE 144*  --  188*  BUN 21*  --  19  CREATININE 1.03 0.92 0.82  CALCIUM 8.6*  --  8.3*  MG  --  2.1  --   AST 19  --   --   ALT 16*  --   --   ALKPHOS 108  --   --   BILITOT 1.6*  --   --    Cardiac Enzymes  Recent Labs Lab 04/04/17 0519  TROPONINI <0.03   RADIOLOGY:  Dg Chest Port 1 View  Result Date: 04/04/2017 CLINICAL DATA:  Acute respiratory failure EXAM: PORTABLE CHEST 1 VIEW COMPARISON:  04/03/2017 FINDINGS: COPD without hyperinflation. Coarse lung markings in the bases bilaterally. Progression of airspace disease in both lung bases suggesting superimposed atelectasis or pneumonia. Possible underlying scarring in the bases also present. Negative for heart failure or effusion. Atherosclerotic aorta IMPRESSION: COPD. Coarse lung markings in the bases have progressed suggesting superimposed pneumonia. Electronically Signed   By: Marlan Palauharles  Clark M.D.  On: 04/04/2017 07:04   Dg Chest Port 1 View  Result Date: 04/03/2017 CLINICAL DATA:  Shortness of breath.  Wheezing. EXAM: PORTABLE CHEST 1 VIEW COMPARISON:  No prior . FINDINGS: Mediastinum hilar structures are normal. Heart size normal. Bilateral interstitial prominence noted. These changes could be chronic. Active interstitial process including pneumonitis and interstitial edema cannot be excluded. COPD . Bibasilar atelectasis . No  pleural effusion or pneumothorax. IMPRESSION: 1. Bilateral interstitial prominence, these changes may be chronic. An active interstitial process such as interstitial pneumonitis or interstitial edema cannot be excluded. 2. COPD. Bibasilar atelectasis. Follow-up chest x-ray may prove useful for continued evaluation. 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  He presented to [facility] ER via EMS from [facility] 07/20 with shortness of breath, fever, cough, and congestion.  Per EMS arrival he was febrile with a temp of 100 F, tachycardic, and tachypneic.  Therefore, en route to the ER he received duoneb treatment and solumedrol with mild improvement of symptoms on 3L O2 via nasal canula.   1. Acute on chronic hypoxic respiratory failure secondary to COPD exacerbation -Patient was admitted to ICU was on BiPAP. Patient is off BiPAP now on 3-4 L nasal Oxygen -Continue steroids, nebulizer, inhalers  2. Bibasilar pneumonia with sepsis -IV Zosyn. -MRSA PCR negative. Consider DC vancomycin. Spoke with pharmacy. -Follow up blood culture.  3. Leukocytosis due to pneumonia.  4. Anemia of chronic disease Hemoglobin stable.  5. DVT prophylaxis subcutaneous Lovenox  Case discussed with Care Management/Social Worker. Management plans discussed with the patient, family and they are in agreement.  CODE STATUS: Full  DVT Prophylaxis: Lovenox  TOTAL TIME TAKING CARE OF THIS PATIENT: 25 minutes.  >50% time spent on counselling and coordination of care  POSSIBLE D/C IN 2-3 DAYS, DEPENDING ON CLINICAL CONDITION.  Note: This dictation was prepared with Dragon dictation along with smaller phrase technology. Any transcriptional errors that result from this process are unintentional.  Grahm Etsitty M.D on 04/04/2017 at 1:31 PM  Between 7am to 6pm - Pager - [phone]  After  6pm go to [email or web address] - Social research officer, government  [facility]  Office  [phone]  CC: Primary care physician; Keane Police, MD

## 2017-04-04 NOTE — Consult Note (Signed)
Name: Johnny Cisneros MRN: [id number] DOB: 1931-03-13    ADMISSION DATE:  04/03/2017 CONSULTATION DATE: 04/03/2017  REFERRING MD : Dr. 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  He presented to [facility] ER via EMS from [facility] 07/20 with shortness of breath, fever, cough, and congestion.  Per EMS arrival he was febrile with a temp of 100 F, tachycardic, and tachypneic.  Therefore, en route to the ER he received duoneb treatment and solumedrol with mild improvement of symptoms on 3L O2 via nasal canula.  In the ER pt remained hypoxic with O2 sats 82%, therefore he was placed on continuous Bipap.  He was subsequently admitted to the [facility] Unit by the hospitalist team for further workup and treatment PCCM consulted.   SUBJECTIVE Off of biPAP Still SOB On minimal oxygen  Eating breakfast this AM   REVIEW OF SYSTEMS: Positives in BOLD  Constitutional: fever, chills, weight loss, malaise/fatigue and diaphoresis.  HENT: hearing loss, ear pain, nosebleeds, congestion, sore throat, neck pain, tinnitus and ear discharge.   Eyes: Negative for blurred vision, double vision, photophobia, pain, discharge and redness.  Respiratory: cough, hemoptysis, sputum production, shortness of breath, wheezing and stridor.   Cardiovascular: Negative for chest pain, palpitations, orthopnea, claudication, leg swelling and PND.  All other ROS negative   VITAL SIGNS: Temp:  [97.4 F (36.3 C)-98.8 F (37.1 C)] 97.4 F (36.3 C) (07/21 0800) Pulse Rate:  [87-123] 95 (07/21 0800) Resp:  [14-32] 19 (07/21 0800) BP:  (104-141)/(51-91) 123/78 (07/21 0800) SpO2:  [89 %-98 %] 96 % (07/21 0800) FiO2 (%):  [40 %] 40 % (07/20 1952) Weight:  [139 lb (63 kg)-144 lb 2.9 oz (65.4 kg)] 144 lb 2.9 oz (65.4 kg) (07/20 1600)  PHYSICAL EXAMINATION: General: ill appearing Caucasian male, NAD  Neuro: alert and oriented, follows commands  HEENT: supple, no JVD Cardiovascular: sinus tach, s1s2, no M/R/G Lungs: rhonchi throughout, even, non labored on continuous Bipap Abdomen: +BS x4, soft, non tender, non distended Musculoskeletal: normal bulk and tone, no edema  Skin: intact no rashes or lesions   Recent Labs Lab 04/03/17 1202 04/03/17 1646 04/04/17 0024  NA 134*  --  133*  K 4.4  --  4.1  CL 101  --  103  CO2 26  --  25  BUN 21*  --  19  CREATININE 1.03 0.92 0.82  GLUCOSE 144*  --  188*    Recent Labs Lab 04/03/17 1202 04/04/17 0024  HGB 12.3* 11.5*  HCT 36.2* 33.7*  WBC 21.8* 12.8*  PLT 165 129*    ASSESSMENT / PLAN: Acute on chronic hypoxic respiratory failure secondary to AECOPD and bilateral pneumonia   Leukocytosis  Mildly elevated troponin likely secondary to demand ischemia due to respiratory failure  Anemia without acute blood loss  Hx: 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  [facility]  Medical Director [facility] Medical Director [facility] Cardio[facility]

## 2017-04-05 LAB — BASIC METABOLIC PANEL
ANION GAP: 7 (ref 5–15)
BUN: 24 mg/dL — ABNORMAL HIGH (ref 6–20)
CHLORIDE: 104 mmol/L (ref 101–111)
CO2: 28 mmol/L (ref 22–32)
Calcium: 8.3 mg/dL — ABNORMAL LOW (ref 8.9–10.3)
Creatinine, Ser: 0.94 mg/dL (ref 0.61–1.24)
GFR calc non Af Amer: >60 mL/min (ref >60)
Glucose, Bld: 152 mg/dL — ABNORMAL HIGH (ref 65–99)
Potassium: 4.2 mmol/L (ref 3.5–5.1)
SODIUM: 139 mmol/L (ref 135–145)

## 2017-04-05 LAB — URINE CULTURE

## 2017-04-05 LAB — MAGNESIUM: MAGNESIUM: 2.1 mg/dL (ref 1.7–2.4)

## 2017-04-05 LAB — PROCALCITONIN: Procalcitonin: 0.71 ng/mL

## 2017-04-05 MED ORDER — IPRATROPIUM-ALBUTEROL 0.5-2.5 (3) MG/3ML IN SOLN: 3.0000 mL | Freq: Once | RESPIRATORY_TRACT | Status: DC

## 2017-04-05 MED ORDER — ENSURE ENLIVE PO LIQD
237.0000 mL | Freq: Two times a day (BID) | ORAL | Status: DC
  Administered 2017-04-05: 237 mL via ORAL

## 2017-04-05 MED ORDER — ORAL CARE MOUTH RINSE
15.0000 mL | Freq: Two times a day (BID) | OROMUCOSAL | Status: DC
  Administered 2017-04-05 – 2017-04-07 (×4): 15 mL via OROMUCOSAL

## 2017-04-05 MED ORDER — METHYLPREDNISOLONE SODIUM SUCC 125 MG IJ SOLR
60.0000 mg | Freq: Once | INTRAMUSCULAR | Status: AC
  Administered 2017-04-05: 60 mg via INTRAVENOUS
  Filled 2017-04-05: qty 2

## 2017-04-05 NOTE — Consult Note (Signed)
Name: Johnny Cisneros MRN: [id number] DOB: [birth year]    ADMISSION DATE:  04/03/2017 CONSULTATION DATE: 04/03/2017  REFERRING MD : Dr. 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  He presented to [facility] ER via EMS from [facility] 07/20 with shortness of breath, fever, cough, and congestion.  Per EMS arrival he was febrile with a temp of 100 F, tachycardic, and tachypneic.  Therefore, en route to the ER he received duoneb treatment and solumedrol with mild improvement of symptoms on 3L O2 via nasal canula.  In the ER pt remained hypoxic with O2 sats 82%, therefore he was placed on continuous Bipap.  He was subsequently admitted to the [facility] Unit by the hospitalist team for further workup and treatment PCCM consulted.   SUBJECTIVE Off of biPAP Still SOB, slight increased work of breathing On minimal oxygen  Bilateral wheezing   REVIEW OF SYSTEMS: Positives in BOLD  Constitutional: fever, chills, weight loss, malaise/fatigue and diaphoresis.  HENT: hearing loss, ear pain, nosebleeds, congestion, sore throat, neck pain, tinnitus and ear discharge.   Eyes: Negative for blurred vision, double vision, photophobia, pain, discharge and redness.  Respiratory: cough, hemoptysis, sputum production, shortness of breath, wheezing and stridor.   Cardiovascular: Negative for chest pain, palpitations, orthopnea, claudication, leg swelling and PND.  All other ROS negative   VITAL SIGNS: Temp:  [97.5 F (36.4 C)-97.9 F (36.6 C)] 97.5 F (36.4 C) (07/22 0200) Pulse Rate:  [86-113] 100 (07/22 0600) Resp:   [16-27] 20 (07/22 0600) BP: (111-138)/(53-103) 116/75 (07/22 0600) SpO2:  [86 %-96 %] 93 % (07/22 0724)  PHYSICAL EXAMINATION: General: ill appearing Caucasian male, NAD  Neuro: alert and oriented, follows commands  HEENT: supple, no JVD Cardiovascular: sinus tach, s1s2, no M/R/G Lungs: rhonchi throughout, even, labored breathing with increased wheezing Abdomen: +BS x4, soft, non tender, non distended Musculoskeletal: normal bulk and tone, no edema  Skin: intact no rashes or lesions   Recent Labs Lab 04/03/17 1202 04/03/17 1646 04/04/17 0024 04/05/17 0507  NA 134*  --  133* 139  K 4.4  --  4.1 4.2  CL 101  --  103 104  CO2 26  --  25 28  BUN 21*  --  19 24*  CREATININE 1.03 0.92 0.82 0.94  GLUCOSE 144*  --  188* 152*    Recent Labs Lab 04/03/17 1202 04/04/17 0024  HGB 12.3* 11.5*  HCT 36.2* 33.7*  WBC 21.8* 12.8*  PLT 165 129*    ASSESSMENT / PLAN: Acute on chronic hypoxic respiratory failure secondary to AECOPD and bilateral pneumonia   Leukocytosis  Mildly elevated troponin likely secondary to demand ischemia due to respiratory failure  Anemia without acute blood loss  Hx: 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  [facility]  Medical Director [facility] Medical Director [facility] Cardio-Pulmonary Department

## 2017-04-05 NOTE — Progress Notes (Signed)
Initial Nutrition Assessment  DOCUMENTATION CODES:   Not applicable  INTERVENTION:  Provide Ensure Enlive po BID, each supplement provides 350 kcal and 20 grams of protein.   Provide Magic cup BID with lunch and dinner, each supplement provides 290 kcal and 9 grams of protein.   Encouraged adequate intake of calories and protein at meals.  NUTRITION DIAGNOSIS:   Inadequate oral intake related to poor appetite, social / environmental circumstances (dislik[facility] food at [facility]) as evidenced by per patient/family report.  GOAL:   Patient will meet greater than or equal to 90% of their needs  MONITOR:   PO intake, Supplement acceptance, Labs, Weight trends, I & O's  REASON FOR ASSESSMENT:   Malnutrition Screening Tool    ASSESSMENT:   83 year old male with PMHx of COPD who is followed by hospice [facility] with SOB, fever, cough, congestion found to have acute exacerbation of COPD, PNA with sepsis.   -On admission pt required BiPAP. Now on 3L [place].  Spoke with patient at bedside. He reports he has not been eating well at [facility] because their food is not good. He reports he enjoys the food at [facility]. Patient reports he was only at [facility] for [facility] days before being admitted here. He reports he was previously living in an apartment. He would cook himself breakfast (eggs, sausage, bread). He would then eat "quick" lunches and dinner. He used to eat with his wife, but he reports she recently passed away. Patient could not provide further details on intake. Patient would like to have whole milk when he orders milk instead of 2% milk. Will update preferences in Health [facility].  Patient reports his UBW is 139 lbs and he is not losing weight. Only recent weight in chart is 140 lbs on 02/15/2017 from [facility].   Medications reviewed and include: methylprednisolone 40 mg Q12hrs, senna, vitamin B12 1000 micrograms daily, Zosyn.  Labs reviewed: BUN 24.    Nutrition-Focused physical exam completed. Findings are moderate fat depletion, moderate muscle depletion, and no edema.   Some degree of muscle wasting expected with aging and limited activity.  Diet Order:  Diet regular Room service appropriate? Yes; Fluid consistency: Thin  Skin:  Reviewed, no issues  Last BM:  Unknown  Height:   Ht Readings from Last 1 Encounters:  04/03/17 5\' 7"  (1.702 m)    Weight:   Wt Readings from Last 1 Encounters:  04/03/17 144 lb 2.9 oz (65.4 kg)    Ideal Body Weight:  67.3 kg  BMI:  Body mass index is 22.58 kg/m.  Estimated Nutritional Needs:   Kcal:  1710-1970 (MSJ x 1.3-1.5)  Protein:  85-100 grams (1.3-1.5 grams/kg)  Fluid:  1.6 L/day (25 ml/kg)  EDUCATION NEEDS:   No education needs identified at this time  Helane RimaLeanne Burnice Vassel, MS, RD, LDN Pager: [phone] After Hours Pager: [phone]

## 2017-04-05 NOTE — Progress Notes (Signed)
[facility] -  at [facility]   PATIENT NAME: Johnny Cisneros    MR#:  [id number]  DATE OF BIRTH:  10/04/1930  SUBJECTIVE:   Patient feeling a lot better today. Eating lunch. On 3 L nasal oxygen REVIEW OF SYSTEMS:   Review of Systems  Constitutional: Negative for chills, fever and weight loss.  HENT: Negative for ear discharge, ear pain and nosebleeds.   Eyes: Negative for blurred vision, pain and discharge.  Respiratory: Positive for sputum production, shortness of breath and wheezing. Negative for stridor.   Cardiovascular: Negative for chest pain, palpitations, orthopnea and PND.  Gastrointestinal: Negative for abdominal pain, diarrhea, nausea and vomiting.  Genitourinary: Negative for frequency and urgency.  Musculoskeletal: Negative for back pain and joint pain.  Neurological: Positive for weakness. Negative for sensory change, speech change and focal weakness.  Psychiatric/Behavioral: Negative for depression and hallucinations. The patient is not nervous/anxious.    Tolerating Diet: Yes Tolerating PT: Pending  DRUG ALLERGIES:  No Known Allergies  VITALS:  Blood pressure 118/65, pulse (!) 105, temperature 97.7 F (36.5 C), temperature source Axillary, resp. rate 19, height 5\' 7"  (1.702 m), weight 65.4 kg (144 lb 2.9 oz), SpO2 (!) 89 %.  PHYSICAL EXAMINATION:   Physical Exam  GENERAL:  83 y.o.-year-old patient lying in the bed with no acute distress. Thin cachectic appears chronically ill EYES: Pupils equal, round, reactive to light and accommodation. No scleral icterus. Extraocular muscles intact.  HEENT: Head atraumatic, normocephalic. Oropharynx and nasopharynx clear.  NECK:  Supple, no jugular venous distention. No thyroid enlargement, no tenderness.  LUNGS: Emphysematous chest distant Normal breath sounds bilaterally, no wheezing, rales, rhonchi. No use of accessory muscles of respiration.  CARDIOVASCULAR: S1, S2 normal. No murmurs,  rubs, or gallops.  ABDOMEN: Soft, nontender, nondistended. Bowel sounds present. No organomegaly or mass.  EXTREMITIES: No cyanosis, clubbing or edema b/l.    NEUROLOGIC: Cranial nerves II through XII are intact. No focal Motor or sensory deficits b/l.   PSYCHIATRIC:  patient is alert and oriented x 2  SKIN: No obvious rash, lesion, or ulcer.   LABORATORY PANEL:  CBC  Recent Labs Lab 04/04/17 0024  WBC 12.8*  HGB 11.5*  HCT 33.7*  PLT 129*    Chemistries   Recent Labs Lab 04/03/17 1202  04/05/17 0507  NA 134*  < > 139  K 4.4  < > 4.2  CL 101  < > 104  CO2 26  < > 28  GLUCOSE 144*  < > 152*  BUN 21*  < > 24*  CREATININE 1.03  < > 0.94  CALCIUM 8.6*  < > 8.3*  MG  --   < > 2.1  AST 19  --   --   ALT 16*  --   --   ALKPHOS 108  --   --   BILITOT 1.6*  --   --   < > = values in this interval not displayed. Cardiac Enzymes  Recent Labs Lab 04/04/17 0519  TROPONINI <0.03   RADIOLOGY:  Dg Chest Port 1 View  Result Date: 04/04/2017 CLINICAL DATA:  Acute respiratory failure EXAM: PORTABLE CHEST 1 VIEW COMPARISON:  04/03/2017 FINDINGS: COPD without hyperinflation. Coarse lung markings in the bases bilaterally. Progression of airspace disease in both lung bases suggesting superimposed atelectasis or pneumonia. Possible underlying scarring in the bases also present. Negative for heart failure or effusion. Atherosclerotic aorta IMPRESSION: COPD. Coarse lung markings in the bases have progressed suggesting superimposed  pneumonia. Electronically Signed   By: Marlan Palau M.D.   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  He presented to [facility] ER via EMS from [facility] 07/20 with shortness of breath, fever, cough, and congestion.  Per EMS arrival he was febrile with a temp of 100 F, tachycardic, and tachypneic.  Therefore, en route to the ER he received duoneb treatment and solumedrol with  mild improvement of symptoms on 3L O2 via nasal canula.   1. Acute on chronic hypoxic respiratory failure secondary to COPD exacerbation -Patient was on BiPAP. Patient is off BiPAP now on 3-4 L nasal Oxygen -Continue steroids, nebulizer, inhalers  2. Bibasilar pneumonia with sepsis -IV Zosyn. -MRSA PCR negative. Consider DC vancomycin. Spoke with pharmacy. - blood culture negative so far  3. Leukocytosis due to pneumonia.  4. Anemia of chronic disease Hemoglobin stable.  5. DVT prophylaxis subcutaneous Lovenox  Physical therapy to see  Case discussed with Care Management/Social Worker. Management plans discussed with the patient, family and they are in agreement.  CODE STATUS: Full  DVT Prophylaxis: Lovenox  TOTAL TIME TAKING CARE OF THIS PATIENT: 25 minutes.  >50% time spent on counselling and coordination of care  POSSIBLE D/C IN 2-3 DAYS, DEPENDING ON CLINICAL CONDITION.  Note: This dictation was prepared with Dragon dictation along with smaller phrase technology. Any transcriptional errors that result from this process are unintentional.  Bridgit Eynon M.D on 04/05/2017 at 1:22 PM  Between 7am to 6pm - Pager - [phone]  After 6pm go to [email or web address] - Social research officer, government  [facility]  Office  [phone]  CC: Primary care physician; Keane Police, MD

## 2017-04-06 ENCOUNTER — Inpatient Hospital Stay: Payer: Medicare Other

## 2017-04-06 DIAGNOSIS — J9620 Acute and chronic respiratory failure, unspecified whether with hypoxia or hypercapnia: Secondary | ICD-10-CM

## 2017-04-06 LAB — CBC
HCT: 33 % — ABNORMAL LOW (ref 40.0–52.0)
HEMOGLOBIN: 11.3 g/dL — AB (ref 13.0–18.0)
MCH: 31.2 pg (ref 26.0–34.0)
MCHC: 34.1 g/dL (ref 32.0–36.0)
MCV: 91.3 fL (ref 80.0–100.0)
Platelets: 170 10*3/uL (ref 150–440)
RBC: 3.61 MIL/uL — ABNORMAL LOW (ref 4.40–5.90)
RDW: 14.4 % (ref 11.5–14.5)
WBC: 6.5 10*3/uL (ref 3.8–10.6)

## 2017-04-06 LAB — GLUCOSE, CAPILLARY
Glucose-Capillary: 197 mg/dL — ABNORMAL HIGH (ref 65–99)
Glucose-Capillary: 119 mg/dL — ABNORMAL HIGH (ref 65–99)

## 2017-04-06 MED ORDER — TIOTROPIUM BROMIDE MONOHYDRATE 18 MCG IN CAPS: 18.0000 ug | ORAL_CAPSULE | Freq: Every day | RESPIRATORY_TRACT | Status: DC

## 2017-04-06 MED ORDER — AMOXICILLIN-POT CLAVULANATE 875-125 MG PO TABS
1.0000 | ORAL_TABLET | Freq: Two times a day (BID) | ORAL | Status: DC
  Administered 2017-04-06 – 2017-04-07 (×3): 1 via ORAL
  Filled 2017-04-06 (×4): qty 1

## 2017-04-06 MED ORDER — INSULIN ASPART 100 UNIT/ML ~~LOC~~ SOLN
0.0000 [IU] | Freq: Three times a day (TID) | SUBCUTANEOUS | Status: DC
Start: 2017-04-06 — End: 2017-04-07
  Administered 2017-04-06: 2 [IU] via SUBCUTANEOUS
  Administered 2017-04-07: 1 [IU] via SUBCUTANEOUS
  Filled 2017-04-06 (×2): qty 1

## 2017-04-06 NOTE — Plan of Care (Signed)
[facility] [phone] [facility] Called to check on patient

## 2017-04-06 NOTE — Care Management (Signed)
Met with patient after learning he is affiliated with [facility]. He desires to stay at [facility] and then back to [facility] at discharge. He states he's been there for one week. He states his niece is health care power of attorney however he states he is able to manage his health care decisions at this time.  I have notified Bonner Puna with [facility] of patient admission; [place] form to remain at [facility] faxed. I understand that patient is open to [facility] at [facility].

## 2017-04-06 NOTE — Progress Notes (Signed)
[facility] Medicine Progess Note    SYNOPSIS   83 yo male admitted 07/20 with acute on chronic hypoxic respiratory failure secondary to AECOPD requiring continuous Bipap   ASSESSMENT/PLAN    PULMONARY A:Severe acute exacerbation of COPD, with underlying severe baseline emphysema/COPD, now with acute hypoxic respiratory failure. I personally reviewed the imaging, chest x-ray shows severe hyperinflation consistent with emphysema. No evidence of infiltrate noted.  P:   -Continue nebulized dilators, continue IV steroids. -Severe baseline emphysema, will need outpatient follow-up with pulmonary. Given age and comorbidities, would consider DO NOT RESUSCITATE status. -Patient is apparently followed by hospice service in the outpatient setting, I'm not certain as to whether the patient is actually enrolled in hospice or in a palliative   INTAKE / OUTPUT:  Intake/Output Summary (Last 24 hours) at 04/06/17 0818 Last data filed at 04/06/17 0646  Gross per 24 hour  Intake            707.5 ml  Output              626 ml  Net             81.5 ml    INFECTIOUS A:  Procalcitonin negative, imaging shows no evidence of pneumonia. P:    Micro/culture results:  BCx2 7/20: Negative MRSA PCR 7/20: Negative  Antibiotics: Piperacillin/tazobactam 7/20 >>  vancomycin 7/20 >>   ENDOCRINE A: SSI.   NEUROLOGIC A:  lethargic, likely due to acute metabolic encephalopathy. P:   -Continue to treat underlying illness, we'll continue to monitor.   MAJOR EVENTS/TEST RESULTS:   Best Practices  DVT Prophylaxis: Heparin subcutaneous. GI Prophylaxis: --   ---------------------------------------   ----------------------------------------   Name: Johnny Cisneros MRN: [id number] DOB: 07/03/1931    ADMISSION DATE:  04/03/2017   SUBJECTIVE:   Pt currently lethargic, cannot provide history or review of systems.  Review of Systems:  --   VITAL SIGNS: Temp:  [98.1 F  (36.7 C)-98.2 F (36.8 C)] 98.1 F (36.7 C) (07/23 0200) Pulse Rate:  [98-106] 100 (07/23 0500) Resp:  [15-30] 15 (07/23 0600) BP: (118-139)/(59-84) 122/66 (07/23 0400) SpO2:  [89 %-98 %] 97 % (07/23 0600)     PHYSICAL EXAMINATION: Physical Examination:   VS: BP 122/66   Pulse 100   Temp 98.1 F (36.7 C) (Oral)   Resp 15   Ht 5\' 7"  (1.702 m)   Wt 144 lb 2.9 oz (65.4 kg)   SpO2 97%   BMI 22.58 kg/m   General Appearance: No distress  Neuro:without focal findings, mental status reduced, patient wakes up to voice, but quickly falls back to sleep. HEENT: PERRLA, EOM intact. Pulmonary: Bilateral scattered wheezing.  CardiovascularNormal S1,S2.  No m/r/g.   Abdomen: Benign, Soft, non-tender. Renal:  No costovertebral tenderness  GU:  Not performed at this time. Endocrine: No evident thyromegaly. Skin:   warm, no rashes, no ecchymosis  Extremities: normal, no cyanosis, clubbing.    LABORATORY PANEL:   CBC  Recent Labs Lab 04/06/17 0454  WBC 6.5  HGB 11.3*  HCT 33.0*  PLT 170    Chemistries   Recent Labs Lab 04/03/17 1202  04/05/17 0507  NA 134*  < > 139  K 4.4  < > 4.2  CL 101  < > 104  CO2 26  < > 28  GLUCOSE 144*  < > 152*  BUN 21*  < > 24*  CREATININE 1.03  < > 0.94  CALCIUM 8.6*  < > 8.3*  MG  --   < > 2.1  AST 19  --   --   ALT 16*  --   --   ALKPHOS 108  --   --   BILITOT 1.6*  --   --   < > = values in this interval not displayed.   Recent Labs Lab 04/03/17 1638  GLUCAP 164*   No results for input(s): PHART, PCO2ART, PO2ART in the last 168 hours.  Recent Labs Lab 04/03/17 1202  AST 19  ALT 16*  ALKPHOS 108  BILITOT 1.6*  ALBUMIN 3.2*    Cardiac Enzymes  Recent Labs Lab 04/04/17 0519  TROPONINI <0.03    RADIOLOGY:  Dg Chest Port 1 View  Result Date: 04/06/2017 CLINICAL DATA:  Respiratory failure EXAM: PORTABLE CHEST 1 VIEW COMPARISON:  03/25/2017 FINDINGS: Mild bibasilar scarring. No focal consolidation. No pleural  effusion or pneumothorax. The heart is normal in size IMPRESSION: No evidence of acute cardiopulmonary disease. Electronically Signed   By: Charline Bills M.D.   On: 04/06/2017 07:30        --Wells Guiles, MD.  ICU Pager: [phone] [facility] Office Number: [phone]   04/06/2017

## 2017-04-06 NOTE — Progress Notes (Signed)
[facility] [place] [facility]   PATIENT NAME: Johnny DoveJames Pratte    MR#:  [id number]  DATE OF BIRTH:  10/29/1930  SUBJECTIVE:   . Out in the chair Patient feeling a lot better today. Eating lunch. On 3 L nasal oxygen REVIEW OF SYSTEMS:   Review of Systems  Constitutional: Negative for chills, fever and weight loss.  HENT: Negative for ear discharge, ear pain and nosebleeds.   Eyes: Negative for blurred vision, pain and discharge.  Respiratory: Positive for sputum production, shortness of breath and wheezing. Negative for stridor.   Cardiovascular: Negative for chest pain, palpitations, orthopnea and PND.  Gastrointestinal: Negative for abdominal pain, diarrhea, nausea and vomiting.  Genitourinary: Negative for frequency and urgency.  Musculoskeletal: Negative for back pain and joint pain.  Neurological: Positive for weakness. Negative for sensory change, speech change and focal weakness.  Psychiatric/Behavioral: Negative for depression and hallucinations. The patient is not nervous/anxious.    Tolerating Diet: Yes Tolerating PT: SNF  DRUG ALLERGIES:  No Known Allergies  VITALS:  Blood pressure 111/66, pulse 100, temperature 98.1 F (36.7 C), temperature source Oral, resp. rate (!) 22, height 5\' 7"  (1.702 m), weight 65.4 kg (144 lb 2.9 oz), SpO2 96 %.  PHYSICAL EXAMINATION:   Physical Exam  GENERAL:  83 y.o.-year-old patient lying in the bed with no acute distress. Thin cachectic appears chronically ill EYES: Pupils equal, round, reactive to light and accommodation. No scleral icterus. Extraocular muscles intact.  HEENT: Head atraumatic, normocephalic. Oropharynx and nasopharynx clear.  NECK:  Supple, no jugular venous distention. No thyroid enlargement, no tenderness.  LUNGS: Emphysematous chest distant Normal breath sounds bilaterally, no wheezing, rales, rhonchi. No use of accessory muscles of respiration.  CARDIOVASCULAR: S1, S2 normal. No  murmurs, rubs, or gallops.  ABDOMEN: Soft, nontender, nondistended. Bowel sounds present. No organomegaly or mass.  EXTREMITIES: No cyanosis, clubbing or edema b/l.    NEUROLOGIC: Cranial nerves II through XII are intact. No focal Motor or sensory deficits b/l.   PSYCHIATRIC:  patient is alert and oriented x 2  SKIN: No obvious rash, lesion, or ulcer.   LABORATORY PANEL:  CBC  Recent Labs Lab 04/06/17 0454  WBC 6.5  HGB 11.3*  HCT 33.0*  PLT 170    Chemistries   Recent Labs Lab 04/03/17 1202  04/05/17 0507  NA 134*  < > 139  K 4.4  < > 4.2  CL 101  < > 104  CO2 26  < > 28  GLUCOSE 144*  < > 152*  BUN 21*  < > 24*  CREATININE 1.03  < > 0.94  CALCIUM 8.6*  < > 8.3*  MG  --   < > 2.1  AST 19  --   --   ALT 16*  --   --   ALKPHOS 108  --   --   BILITOT 1.6*  --   --   < > = values in this interval not displayed. Cardiac Enzymes  Recent Labs Lab 04/04/17 0519  TROPONINI <0.03   RADIOLOGY:  Dg Chest Port 1 View  Result Date: 04/06/2017 CLINICAL DATA:  Respiratory failure EXAM: PORTABLE CHEST 1 VIEW COMPARISON:  03/25/2017 FINDINGS: Mild bibasilar scarring. No focal consolidation. No pleural effusion or pneumothorax. The heart is normal in size IMPRESSION: No evidence of acute cardiopulmonary disease. Electronically Signed   By: Charline BillsSriyesh  Krishnan M.D.   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  He presented to [facility] ER via EMS from [facility] 07/20 with shortness of breath, fever, cough, and congestion.  Per EMS arrival he was febrile with a temp of 100 F, tachycardic, and tachypneic.  Therefore, en route to the ER he received duoneb treatment and solumedrol with mild improvement of symptoms on 3L O2 via nasal canula.   1. Acute on chronic hypoxic respiratory failure secondary to COPD exacerbation -Patient was on BiPAP. Patient is off BiPAP now on 3-4 L nasal Oxygen -Continue  steroids, nebulizer, inhalers  2. Bibasilar pneumonia with sepsis -IV Zosyn---change to oral augmentin -MRSA PCR negative. Consider DC vancomycin. Spoke with pharmacy. - blood culture negative so far  3. Leukocytosis due to pneumonia.  4. Anemia of chronic disease Hemoglobin stable.  5. DVT prophylaxis subcutaneous Lovenox  Physical therapy to see. Pt is from [facility] for d/c planning in 1-2 days  Case discussed with Care Management/Social Worker. Management plans discussed with the patient, family and they are in agreement.  CODE STATUS: Full  DVT Prophylaxis: Lovenox  TOTAL TIME TAKING CARE OF THIS PATIENT: 25 minutes.  >50% time spent on counselling and coordination of care    Note: This dictation was prepared with Dragon dictation along with smaller phrase technology. Any transcriptional errors that result from this process are unintentional.  Janise Gora M.D on 04/06/2017 at 5:53 PM  Between 7am to 6pm - Pager - [phone]  After 6pm go to [email or web address] - Social research officer, government  [facility]  Office  [phone]  CC: Primary care physician; Keane Police, MD

## 2017-04-07 LAB — BASIC METABOLIC PANEL
Anion gap: 4 — ABNORMAL LOW (ref 5–15)
BUN: 19 mg/dL (ref 6–20)
CALCIUM: 8.3 mg/dL — AB (ref 8.9–10.3)
CO2: 31 mmol/L (ref 22–32)
CREATININE: 0.8 mg/dL (ref 0.61–1.24)
Chloride: 105 mmol/L (ref 101–111)
GFR calc Af Amer: >60 mL/min (ref >60)
GFR calc non Af Amer: >60 mL/min (ref >60)
GLUCOSE: 97 mg/dL (ref 65–99)
Potassium: 4.3 mmol/L (ref 3.5–5.1)
Sodium: 140 mmol/L (ref 135–145)

## 2017-04-07 LAB — CBC
HEMATOCRIT: 32.1 % — AB (ref 40.0–52.0)
Hemoglobin: 10.9 g/dL — ABNORMAL LOW (ref 13.0–18.0)
MCH: 30.9 pg (ref 26.0–34.0)
MCHC: 33.8 g/dL (ref 32.0–36.0)
MCV: 91.2 fL (ref 80.0–100.0)
Platelets: 166 10*3/uL (ref 150–440)
RBC: 3.52 MIL/uL — ABNORMAL LOW (ref 4.40–5.90)
RDW: 14.2 % (ref 11.5–14.5)
WBC: 7.4 10*3/uL (ref 3.8–10.6)

## 2017-04-07 LAB — GLUCOSE, CAPILLARY
Glucose-Capillary: 88 mg/dL (ref 65–99)
Glucose-Capillary: 144 mg/dL — ABNORMAL HIGH (ref 65–99)
Glucose-Capillary: 222 mg/dL — ABNORMAL HIGH (ref 65–99)

## 2017-04-07 LAB — PROCALCITONIN: Procalcitonin: 0.18 ng/mL

## 2017-04-07 MED ORDER — ENSURE ENLIVE PO LIQD: 237.0000 mL | Freq: Two times a day (BID) | ORAL | 12 refills | Status: AC

## 2017-04-07 MED ORDER — PREDNISONE 10 MG PO TABS: ORAL_TABLET | ORAL | 0 refills | Status: DC

## 2017-04-07 MED ORDER — AMOXICILLIN-POT CLAVULANATE 875-125 MG PO TABS: 1.0000 | ORAL_TABLET | Freq: Two times a day (BID) | ORAL | 0 refills | Status: DC

## 2017-04-07 MED ORDER — PREDNISONE 50 MG PO TABS
50.0000 mg | ORAL_TABLET | Freq: Every day | ORAL | Status: DC
  Administered 2017-04-07: 50 mg via ORAL
  Filled 2017-04-07: qty 1

## 2017-04-07 NOTE — Progress Notes (Signed)
[facility] AVS given and explained to pt. Pt verbalized understanding. Additional copy of AVS placed in discharge envelope along with RX. [facility] L[facility]. Called EMS.

## 2017-04-07 NOTE — Evaluation (Signed)
Physical Therapy Evaluation Patient Details Name: Johnny Cisneros MRN: [id number] DOB: 1931/08/07 Today's Date: 04/07/2017   History of Present Illness  Pt is a 83 yo M admitted to acute care with acute on chronic respiratory failure. Prior to admission, pt required 24 hr care and amb with RW. PMH: end stage COPD and chronic respiratory failure,   Clinical Impression  Pt is pleasant and eager to participate for return to PLOF. Prior to treatment nursing notified SPT of recently weaning pt back to baseline of 2 L supplemental O2; SPT was granted permission to increase O2 supplementation levels prn during treatment. Pt performs bed mobility, tranfers, and ambulation with Min Guard, due to impaired strength, balance, and muscular and cardiopulmonary endurance. Pt amb total of 140 ft with RW, requiring multiple standing rest breaks and one seated rest break after ~80 ft. Pt with RPE of 6/10 following amb. Pt on 2 L supplemental O2 with bed therex and bed mobility and 3 L supplemental O2 with amb. Pt O2 sat dropped to 86% with amb, with no reported signs/sx. O2 saturation was quick to recover with cues to practice pursed lipped breathing and with seated rest break. Overall, pt responded well to today's treatment with no adverse affects. Pt would benefit from skilled PT to address the previously mentioned impairments and promote return to PLOF. Based on previously mention impairments, currently recommending HHPT pending d/c.     Follow Up Recommendations Home health PT    Equipment Recommendations  None recommended by PT    Recommendations for Other Services       Precautions / Restrictions Precautions Precautions: Fall Restrictions Weight Bearing Restrictions: No      Mobility  Bed Mobility Overal bed mobility: Needs Assistance Bed Mobility: Supine to Sit     Supine to sit: Min guard     General bed mobility comments: Pt required increased time for bed mobility and min verbal cues for  mechancis and safety. HOB elevated, and utilized L hand rail to come to sit. Pt demo good carryover with cues. Pt on 2 L supplemental O2, dropping to 88% O2 sat upon sitting, but recovering quickly with pursed lipped breathing.   Transfers Overall transfer level: Needs assistance Equipment used: Rolling walker (2 wheeled) Transfers: Sit to/from Stand Sit to Stand: Min guard         General transfer comment: Pt min guard with STS transfers, requiring increased time and and B UE support upon standing. Pt education re: correct mechanics with hand and ft placement and safety awareness. Pt demo good understanding and carryover of education.   Ambulation/Gait Ambulation/Gait assistance: Min guard Ambulation Distance (Feet): 140 Feet Assistive device: Rolling walker (2 wheeled) Gait Pattern/deviations: Step-through pattern;Decreased step length - right;Decreased step length - left     General Gait Details: Pt demo good reciprocal gait, amb total of 140 ft with 3 stadning rest breaks after ~20-30 ft and 1 seated rest break after ~80 ft. Pt O2 saturation dropping to max 86% during amb; pt recovered quickly with cues to withstand breath-holding, practice pursed lipped breathing, and seated rest break. Pt on 3 L supplemental O2 during amb.   Stairs            Wheelchair Mobility    Modified Rankin (Stroke Patients Only)       Balance Overall balance assessment: Needs assistance Sitting-balance support: Bilateral upper extremity supported;Feet supported Sitting balance-Leahy Scale: Good Sitting balance - Comments: good sitting balance, requiring no cues for mechanics and safety  awareness.    Standing balance support: Bilateral upper extremity supported Standing balance-Leahy Scale: Fair Standing balance comment: Pt with fair standing balance, using RW for B UE support. Requires min cues for mechanics and safety.                              Pertinent Vitals/Pain Pain  Assessment: No/denies pain    Home Living Family/patient expects to be discharged to:: [facility]               Home Equipment: Dan HumphreysWalker - 2 wheels      Prior Function Level of Independence: Needs assistance         Comments[facility] prior to admission, requiring assits for all ADL's. Baseline use of 2L supplemental O2.      Hand Dominance        Extremity/Trunk Assessment   Upper Extremity Assessment Upper Extremity Assessment: Overall WFL for tasks assessed    Lower Extremity Assessment Lower Extremity Assessment: Generalized weakness (MMT to B LE's grossly 4/5)       Communication   Communication: No difficulties  Cognition Arousal/Alertness: Awake/alert Behavior During Therapy: WFL for tasks assessed/performed Overall Cognitive Status: Within Functional Limits for tasks assessed                                 General Comments: Pt pleasant and eager to participate.       General Comments      Exercises Other Exercises Other Exercises: Supine therex to B LEs with supervision x 10 reps: ankle pumps, quad sets, glute sets, SLR, and hip abd. Pt demo good technique, requiring minimal verbal cues. Pt education re: avoiding mouth breathing adn practicing pursed lipped breathing. Breathing in through nose and out through a pursed mouth. Pt on 2 L supplemental O2 during therex, maintaining saturation at 90%.    Assessment/Plan    PT Assessment Patient needs continued PT services  PT Problem List Decreased strength;Decreased activity tolerance;Decreased balance;Decreased mobility;Decreased coordination;Decreased cognition;Decreased safety awareness;Cardiopulmonary status limiting activity       PT Treatment Interventions DME instruction;Gait training;Stair training;Functional mobility training;Therapeutic activities;Therapeutic exercise;Balance training;Neuromuscular  re-education;Patient/family education    PT Goals (Current goals can be found in the Care Plan section)  Acute Rehab PT Goals Patient Stated Goal: to walk  PT Goal Formulation: With patient Time For Goal Achievement: 04/21/17 Potential to Achieve Goals: Good    Frequency Min 2X/week   Barriers to discharge        Co-evaluation               AM-PAC PT "6 Clicks" Daily Activity  Outcome Measure Difficulty turning over in bed (including adjusting bedclothes, sheets and blankets)?: Total Difficulty moving from lying on back to sitting on the side of the bed? : Total Difficulty sitting down on and standing up from a chair with arms (e.g., wheelchair, bedside commode, etc,.)?: Total Help needed moving to and from a bed to chair (including a wheelchair)?: A Little Help needed walking in hospital room?: A Little Help needed climbing 3-5 steps with a railing? : A Lot 6 Click Score: 11    End of Session Equipment Utilized During Treatment: Gait belt;Oxygen Activity Tolerance: Patient tolerated treatment well Patient left: in chair;with call bell/phone within reach;with chair alarm set Nurse Communication: Mobility status;Other (  comment) (Pt request for bath and toileting. ) PT Visit Diagnosis: Unsteadiness on feet (R26.81);Other abnormalities of gait and mobility (R26.89);Muscle weakness (generalized) (M62.81)    Time: 4098-1191 PT Time Calculation (min) (ACUTE ONLY): 38 min   Charges:         PT G Codes:        Sharman Cheek PT, SPT   Latanya Maudlin 04/07/2017, 1:25 PM

## 2017-04-07 NOTE — Discharge Summary (Signed)
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 COPD (chronic obstructive pulmonary disease) [facility])     HOSPITAL COURSE:   83 yo male with a PMH of End Stage COPD on chronic home O2 @3L  followed by hospice. He presented to [facility] ER via EMS from [facility] 07/20 with shortness of breath, fever, cough, and congestion. Per EMS arrival he was febrile with a temp of 100 F, tachycardic, and tachypneic.   1. Acute on chronic hypoxic respiratory failure secondary to COPD exacerbation -Patient has history of end-stage COPD on chronic home oxygen -Patient was on BiPAP. Patient is off BiPAP now on 3-4 L nasal Oxygen -Continue steroids, nebulizer, inhalers -Oral Augmentin  2. Bibasilar pneumonia with sepsis -IV Zosyn---changed to oral Augmentin -MRSA PCR negative.\ - blood culture negative so far  3. Leukocytosis due to pneumonia. -Resolved  4. Anemia of chronic disease Hemoglobin stable.  5. DVT prophylaxis subcutaneous Lovenox  Physical therapy to see  Patient is followed by hospice at [facility] which will be resumed at discharge .  Discussed with patient CODE STATUS. He is requesting DO NOT RESUSCITATE.  CONSULTS OBTAINED:    DRUG ALLERGIES:  No Known Allergies  DISCHARGE MEDICATIONS:   Current Discharge Medication List    START taking these medications   Details   amoxicillin-clavulanate (AUGMENTIN) 875-125 MG tablet Take 1 tablet by mouth every 12 (twelve) hours. Qty: 12 tablet, Refills: 0    feeding supplement, ENSURE ENLIVE, (ENSURE ENLIVE) LIQD Take 237 mLs by mouth 2 (two) times daily between meals. Qty: 237 mL, Refills: 12    predniSONE (DELTASONE) 10 MG tablet Start 50 mg daily. Taper by 10 mg daily then stop. Qty: 15 tablet, Refills: 0      CONTINUE these medications which have NOT CHANGED   Details  albuterol (PROVENTIL) (2.5 MG/3ML) 0.083% nebulizer solution Take 2.5 mg by nebulization every 6 (six) hours as needed for wheezing or shortness of breath.    ALPRAZolam (XANAX) 0.25 MG tablet Take 0.25 mg by mouth 3 (three) times daily as needed for anxiety.    ammonium lactate (LAC-HYDRIN) 12 % lotion Apply 1 application topically 2 (two) times daily.    finasteride (PROSCAR) 5 MG tablet Take 5 mg by mouth daily.    FLUoxetine (PROZAC) 40 MG capsule Take 40 mg by mouth daily.    hydrOXYzine (ATARAX/VISTARIL) 10 MG tablet Take 10 mg by mouth 4 (four) times daily as needed for itching.    mometasone (ASMANEX) 220 MCG/INH inhaler Inhale 2 puffs into the lungs at bedtime.    morphine 20 MG/5ML solution Take 5 mg by mouth 4 (four) times daily. For breathing.    nortriptyline (PAMELOR) 10 MG capsule Take 20 mg by mouth at bedtime.    primidone (MYSOLINE) 50 MG tablet Take 50 mg by mouth 2 (two) times daily.    sennosides-docusate sodium (SENOKOT-S) 8.6-50 MG tablet Take 1 tablet by mouth 2 (two) times daily.  terazosin (HYTRIN) 10 MG capsule Take 10 mg by mouth at bedtime.    theophylline (THEO-24) 300 MG 24 hr capsule Take 150 mg by mouth 2 (two) times daily.    Tiotropium Bromide-Olodaterol (STIOLTO RESPIMAT IN) Inhale 2 puffs into the lungs daily.    vitamin B-12 (CYANOCOBALAMIN) 1000 MCG tablet Take 1,000 mcg by mouth daily.        If you experience worsening of your admission symptoms, develop shortness of breath, life  threatening emergency, suicidal or homicidal thoughts you must seek medical attention immediately by calling 911 or calling your MD immediately  if symptoms less severe.  You Must read complete instructions/literature along with all the possible adverse reactions/side effects for all the Medicines you take and that have been prescribed to you. Take any new Medicines after you have completely understood and accept all the possible adverse reactions/side effects.   Please note  You were cared for by a hospitalist during your hospital stay. If you have any questions about your discharge medications or the care you received while you were in the hospital after you are discharged, you can call the unit and asked to speak with the hospitalist on call if the hospitalist that took care of you is not available. Once you are discharged, your primary care physician will handle any further medical issues. Please note that NO REFILLS for any discharge medications will be authorized once you are discharged, as it is imperative that you return to your primary care physician (or establish a relationship with a primary care physician if you do not have one) for your aftercare needs so that they can reassess your need for medications and monitor your lab values. Today   SUBJECTIVE   Mild cough. Breathing at baseline.  VITAL SIGNS:  Blood pressure 136/76, pulse 92, temperature 98.7 F (37.1 C), resp. rate 20, height 5\' 7"  (1.702 m), weight 62.6 kg (138 lb 1.6 oz), SpO2 97 %.  I/O:   Intake/Output Summary (Last 24 hours) at 04/07/17 0912 Last data filed at 04/07/17 0129  Gross per 24 hour  Intake              600 ml  Output              900 ml  Net             -300 ml    PHYSICAL EXAMINATION:  GENERAL:  83 y.o.-year-old patient lying in the bed with no acute distress. Thin cachectic  EYES: Pupils equal, round, reactive to light and accommodation. No scleral icterus. Extraocular muscles intact.  HEENT: Head  atraumatic, normocephalic. Oropharynx and nasopharynx clear.  NECK:  Supple, no jugular venous distention. No thyroid enlargement, no tenderness.  LUNGS Distanth sounds bilaterally, no wheezing, rales,rhonchi . Emphysematous chest -Bibasilar crepitation. No use of accessory muscles of respiration.  CARDIOVASCULAR: S1, S2 normal. No murmurs, rubs, or gallops.  ABDOMEN: Soft, non-tender, non-distended. Bowel sounds present. No organomegaly or mass.  EXTREMITIES: No pedal edema, cyanosis, or clubbing.  NEUROLOGIC: Cranial nerves II through XII are intact. Muscle strength 5/5 in all extremities. Sensation intact. Gait not checked. Subjective weakness  PSYCHIATRIC: The patient is alert and oriented x 3.  SKIN: No obvious rash, lesion, or ulcer.   DATA REVIEW:   CBC   Recent Labs Lab 04/07/17 0443  WBC 7.4  HGB 10.9*  HCT 32.1*  PLT 166    Chemistries   Recent Labs Lab 04/03/17 1202  04/05/17 0507 04/07/17 0443  NA 134*  < > 139 140  K 4.4  < > 4.2 4.3  CL 101  < > 104 105  CO2 26  < > 28 31  GLUCOSE 144*  < > 152* 97  BUN 21*  < > 24* 19  CREATININE 1.03  < > 0.94 0.80  CALCIUM 8.6*  < > 8.3* 8.3*  MG  --   < > 2.1  --   AST 19  --   --   --   ALT 16*  --   --   --   ALKPHOS 108  --   --   --   BILITOT 1.6*  --   --   --   < > = values in this interval not displayed.  Microbiology Results   Recent Results (from the past 240 hour(s))  Blood Culture (routine x 2)     Status: None (Preliminary result)   Collection Time: 04/03/17 11:39 AM  Result Value Ref Range Status   Specimen Description BLOOD BLOOD RIGHT WRIST  Final   Special Requests   Final    BOTTLES DRAWN AEROBIC AND ANAEROBIC Blood Culture adequate volume   Culture NO GROWTH 4 DAYS  Final   Report Status PENDING  Incomplete  Blood Culture (routine x 2)     Status: None (Preliminary result)   Collection Time: 04/03/17 12:06 PM  Result Value Ref Range Status   Specimen Description BLOOD RIGHT ANTECUBITAL   Final   Special Requests   Final    BOTTLES DRAWN AEROBIC AND ANAEROBIC Blood Culture results may not be optimal due to an excessive volume of blood received in culture bottles   Culture NO GROWTH 4 DAYS  Final   Report Status PENDING  Incomplete  MRSA PCR Screening     Status: None   Collection Time: 04/03/17  4:46 PM  Result Value Ref Range Status   MRSA by PCR NEGATIVE NEGATIVE Final    Comment:        The GeneXpert MRSA Assay (FDA approved for NASAL specimens only), is one component of a comprehensive MRSA colonization surveillance program. It is not intended to diagnose MRSA infection nor to guide or monitor treatment for MRSA infections.   Urine Culture     Status: Abnormal   Collection Time: 04/03/17  4:48 PM  Result Value Ref Range Status   Specimen Description URINE, RANDOM  Final   Special Requests NONE  Final   Culture MULTIPLE SPECIES PRESENT, SUGGEST RECOLLECTION (A)  Final   Report Status 04/05/2017 FINAL  Final    RADIOLOGY:  Dg Chest Port 1 View  Result Date: 04/06/2017 CLINICAL DATA:  Respiratory failure EXAM: PORTABLE CHEST 1 VIEW COMPARISON:  03/25/2017 FINDINGS: Mild bibasilar scarring. No focal consolidation. No pleural effusion or pneumothorax. The heart is normal in size IMPRESSION: No evidence of acute cardiopulmonary disease. Electronically Signed   By: Charline Bills M.D.   On: 04/06/2017 07:30     Management plans discussed with the patient, family and they are in agreement.  CODE STATUS:     Code Status Orders        Start     Ordered   04/07/17 0902  Do not attempt resuscitation (DNR)  Continuous    Question Answer Comment  In the event of cardiac or respiratory ARREST Do not call a "code blue"   In the event of cardiac or respiratory ARREST Do not perform Intubation, CPR, defibrillation or ACLS   In  the event of cardiac or respiratory ARREST Use medication by any route, position, wound care, and other measures to relive pain and  suffering. May use oxygen, suction and manual treatment of airway obstruction as needed for comfort.      04/07/17 0901    Code Status History    Date Active Date Inactive Code Status Order ID Comments User Context   04/03/2017  4:45 PM 04/07/2017  9:01 AM Full Code [id number]  Shaune Pollackhen, Qing, MD Inpatient      TOTAL TIME TAKING CARE OF THIS PATIENT: 40 minutes.    Danette Weinfeld M.D on 04/07/2017 at 9:12 AM  Between 7am to 6pm - Pager - [phone] After 6pm go to [email or web address] - Social research officer, [email or web address] [email or web address]  [facility]  Office  [phone]  CC: Primary care physician; Keane PoliceSlade-Hartman, Venezela, MD

## 2017-04-07 NOTE — [place] FL2 (Signed)
[place] MEDICAID FL2 LEVEL OF CARE SCREENING TOOL     IDENTIFICATION  Patient Name: Johnny DoveJames Moes Birthdate: 11/01/1930 Sex: male Admission Date (Current Location): 04/03/2017  [place]   Facility and Address:  [facility], [address], [place], [place] [address]      Provider Number: [id number]  Attending Physician Name and Address:  Enedina FinnerPatel, Sona, MD  Relative Name and Phone Number:       Current Level of Care: Hospital Recommended Level of Care: Skilled Nursing Facility Prior Approval Number:    Date Approved/Denied:   PASRR Number: [id number]  Discharge Plan: SNF    Current Diagnoses: Patient Active Problem List   Diagnosis Date Noted  . Acute on chronic respiratory failure (HCC) 04/03/2017    Orientation RESPIRATION BLADDER Height & Weight     Self, Time, Situation, Place  O2 (3-4 L) Continent Weight: 138 lb 1.6 oz (62.6 kg) Height:  5\' 7"  (170.2 cm)  BEHAVIORAL SYMPTOMS/MOOD NEUROLOGICAL BOWEL NUTRITION STATUS      Continent Diet (Regular Diet)  AMBULATORY STATUS COMMUNICATION OF NEEDS Skin   Limited Assist Verbally Normal                       Personal Care Assistance Level of Assistance  Bathing, Feeding, Dressing Bathing Assistance: Limited assistance Feeding assistance: Independent Dressing Assistance: Limited assistance     Functional Limitations Info  Sight, Hearing, Speech Sight Info: Adequate Hearing Info: Adequate Speech Info: Adequate    SPECIAL CARE FACTORS FREQUENCY                       Contractures Contractures Info: Not present    Additional Factors Info  Code Status, Allergies, Psychotropic Code Status Info: DNR Allergies Info: No known allergies Psychotropic Info: Medications:  Prozac, Xanax         Current Medications (04/07/2017):  This is the current hospital active medication list Current Facility-Administered Medications  Medication Dose Route  Frequency Provider Last Rate Last Dose  . 0.9 %  sodium chloride infusion  250 mL Intravenous PRN Shaune Pollackhen, Qing, MD   Stopped at 04/06/17 0105  . acetaminophen (TYLENOL) tablet 650 mg  650 mg Oral Q6H PRN Shaune Pollackhen, Qing, MD       Or  . acetaminophen (TYLENOL) suppository 650 mg  650 mg Rectal Q6H PRN Shaune Pollackhen, Qing, MD      . ALPRAZolam Prudy Feeler(XANAX) tablet 0.25 mg  0.25 mg Oral TID PRN Shaune Pollackhen, Qing, MD   0.25 mg at 04/06/17 1621  . amoxicillin-clavulanate (AUGMENTIN) 875-125 MG per tablet 1 tablet  1 tablet Oral Q12H Enedina FinnerPatel, Sona, MD   1 tablet at 04/06/17 2115  . arformoterol (BROVANA) nebulizer solution 15 mcg  15 mcg Nebulization BID Shaune Pollackhen, Qing, MD   15 mcg at 04/07/17 0747  . bisacodyl (DULCOLAX) EC tablet 5 mg  5 mg Oral Daily PRN Shaune Pollackhen, Qing, MD      . budesonide (PULMICORT) nebulizer solution 0.5 mg  0.5 mg Nebulization BID Erin FullingKasa, Kurian, MD   0.5 mg at 04/07/17 0747  . feeding supplement (ENSURE ENLIVE) (ENSURE ENLIVE) liquid 237 mL  237 mL Oral BID BM Enedina FinnerPatel, Sona, MD   237 mL at 04/05/17 1841  . finasteride (PROSCAR) tablet 5 mg  5 mg Oral Daily Shaune Pollackhen, Qing, MD   5 mg at 04/06/17 [id number]  . FLUoxetine (PROZAC) capsule 40 mg  40 mg Oral Daily Shaune Pollackhen, Qing, MD  40 mg at 04/06/17 0829  . heparin injection 5,000 Units  5,000 Units Subcutaneous Q8H Shaune Pollack, MD   5,000 Units at 04/07/17 0532  . HYDROcodone-acetaminophen (NORCO/VICODIN) 5-325 MG per tablet 1-2 tablet  1-2 tablet Oral Q4H PRN Shaune Pollack, MD      . hydrOXYzine (ATARAX/VISTARIL) tablet 10 mg  10 mg Oral QID PRN Shaune Pollack, MD   10 mg at 04/06/17 0829  . insulin aspart (novoLOG) injection 0-9 Units  0-9 Units Subcutaneous TID WC Shane Crutch, MD   2 Units at 04/06/17 1221  . ipratropium-albuterol (DUONEB) 0.5-2.5 (3) MG/3ML nebulizer solution 3 mL  3 mL Nebulization Q4H Erin Fulling, MD   3 mL at 04/07/17 0747  . ipratropium-albuterol (DUONEB) 0.5-2.5 (3) MG/3ML nebulizer solution 3 mL  3 mL Nebulization Once Tukov, Magadalene S, NP      .  MEDLINE mouth rinse  15 mL Mouth Rinse BID Erin Fulling, MD   15 mL at 04/06/17 2116  . morphine 10 MG/5ML solution 5 mg  5 mg Oral Q6H PRN Shaune Pollack, MD   5 mg at 04/06/17 1621  . nortriptyline (PAMELOR) capsule 20 mg  20 mg Oral QHS Shaune Pollack, MD   20 mg at 04/06/17 2115  . ondansetron (ZOFRAN) tablet 4 mg  4 mg Oral Q6H PRN Shaune Pollack, MD       Or  . ondansetron [facility]) injection 4 mg  4 mg Intravenous Q6H PRN Shaune Pollack, MD      . predniSONE (DELTASONE) tablet 50 mg  50 mg Oral Q breakfast Enedina Finner, MD      . primidone (MYSOLINE) tablet 50 mg  50 mg Oral BID Shaune Pollack, MD   50 mg at 04/06/17 2115  . senna-docusate (Senokot-S) tablet 1 tablet  1 tablet Oral QHS PRN Shaune Pollack, MD      . senna-docusate (Senokot-S) tablet 1 tablet  1 tablet Oral BID Shaune Pollack, MD   1 tablet at 04/06/17 2116  . simethicone (MYLICON) chewable tablet 80 mg  80 mg Oral QID PRN Varughese, Bincy S, NP   80 mg at 04/06/17 0830  . sodium chloride flush (NS) 0.9 % injection 3 mL  3 mL Intravenous Q12H Shaune Pollack, MD   3 mL at 04/06/17 2122  . sodium chloride flush (NS) 0.9 % injection 3 mL  3 mL Intravenous PRN Shaune Pollack, MD   3 mL at 04/05/17 1841  . terazosin (HYTRIN) capsule 10 mg  10 mg Oral QHS Shaune Pollack, MD   10 mg at 04/06/17 2116  . theophylline (THEODUR) 12 hr tablet 150 mg  150 mg Oral BID Shaune Pollack, MD   150 mg at 04/06/17 1621  . [START ON 04/09/2017] tiotropium (SPIRIVA) inhalation capsule 18 mcg  18 mcg Inhalation Daily Enedina Finner, MD      . vitamin B-12 (CYANOCOBALAMIN) tablet 1,000 mcg  1,000 mcg Oral Daily Shaune Pollack, MD   1,000 mcg at 04/06/17 1610     Discharge Medications: Please see discharge summary for a list of discharge medications.  Relevant Imaging Results:  Relevant Lab Results:   Additional Information SSN:  [id number]  Dede Query, LCSW

## 2017-04-07 NOTE — Care Management Important Message (Signed)
Important Message  Patient Details  Name: Johnny Cisneros MRN: [id number] Date of Birth: 09/04/1931   Medicare Important Message Given:  Yes    Gwenette GreetBrenda S Liyana Suniga, RN 04/07/2017, 8:10 AM

## 2017-04-07 NOTE — Clinical Social Work Note (Signed)
Pt is ready for discharge today and will return to [facility]. CSW met with pt address consult. CSW introduced herself and explained role of social work. CSW also explained process of discharging to SNF. Pt is in agreement to return to [facility] where he will be followed by [facility]. Facility is ready to accept pt as they have received discharge information. RN to call report. [facility] will provide transportation. CSW called and left a message for pt's niece and requested a return phone call. CSW is signing off as no further needs identified.   Johnny Cisneros, MSW, LCSW  Clinical Social Worker  [phone]

## 2017-04-07 NOTE — Care Management (Signed)
Telephone call to FrankfordEric. [facility] representative. Discussed that Mr. Elayne SnareStrayhorn will be [facility]. Gwenette GreetBrenda S Michaella Imai RN MSN CCM Care Management [phone]

## 2017-04-07 NOTE — Progress Notes (Signed)
Yellow arm band in place. 

## 2017-04-08 LAB — CULTURE, BLOOD (ROUTINE X 2)
CULTURE: NO GROWTH
Culture: NO GROWTH
Special Requests: ADEQUATE

## 2018-03-18 ENCOUNTER — Other Ambulatory Visit: Payer: Self-pay

## 2018-03-18 ENCOUNTER — Emergency Department

## 2018-03-18 ENCOUNTER — Encounter: Payer: Self-pay | Admitting: *Deleted

## 2018-03-18 ENCOUNTER — Emergency Department
Admission: EM | Admit: 2018-03-18 | Discharge: 2018-03-18 | Disposition: A | Discharge status: Other Acute Inpatient Hospital | Attending: Emergency Medicine | Admitting: Emergency Medicine

## 2018-03-18 DIAGNOSIS — Z79899 Other long term (current) drug therapy: Secondary | ICD-10-CM | POA: Insufficient documentation

## 2018-03-18 DIAGNOSIS — J9621 Acute and chronic respiratory failure with hypoxia: Secondary | ICD-10-CM | POA: Insufficient documentation

## 2018-03-18 DIAGNOSIS — J441 Chronic obstructive pulmonary disease with (acute) exacerbation: Secondary | ICD-10-CM

## 2018-03-18 DIAGNOSIS — R0602 Shortness of breath: Secondary | ICD-10-CM | POA: Diagnosis present

## 2018-03-18 LAB — CBC WITH DIFFERENTIAL/PLATELET
BASOS PCT: 0 %
Basophils Absolute: 0 10*3/uL (ref 0–0.1)
EOS PCT: 1 %
Eosinophils Absolute: 0.1 10*3/uL (ref 0–0.7)
HEMATOCRIT: 34.9 % — AB (ref 40.0–52.0)
Hemoglobin: 11.8 g/dL — ABNORMAL LOW (ref 13.0–18.0)
LYMPHS PCT: 11 %
Lymphs Abs: 1.3 10*3/uL (ref 1.0–3.6)
MCH: 30.3 pg (ref 26.0–34.0)
MCHC: 33.8 g/dL (ref 32.0–36.0)
MCV: 89.7 fL (ref 80.0–100.0)
MONOS PCT: 8 %
Monocytes Absolute: 1 10*3/uL (ref 0.2–1.0)
NEUTROS ABS: 9.4 10*3/uL — AB (ref 1.4–6.5)
Neutrophils Relative %: 80 %
PLATELETS: 223 10*3/uL (ref 150–440)
RBC: 3.89 MIL/uL — ABNORMAL LOW (ref 4.40–5.90)
RDW: 14.1 % (ref 11.5–14.5)
WBC: 11.9 10*3/uL — ABNORMAL HIGH (ref 3.8–10.6)

## 2018-03-18 LAB — COMPREHENSIVE METABOLIC PANEL
ALBUMIN: 2.9 g/dL — AB (ref 3.5–5.0)
ALK PHOS: 96 U/L (ref 38–126)
ALT: 25 U/L (ref 0–44)
AST: 18 U/L (ref 15–41)
Anion gap: 6 (ref 5–15)
BUN: 19 mg/dL (ref 8–23)
CO2: 30 mmol/L (ref 22–32)
CREATININE: 0.77 mg/dL (ref 0.61–1.24)
Calcium: 8.8 mg/dL — ABNORMAL LOW (ref 8.9–10.3)
Chloride: 104 mmol/L (ref 98–111)
GFR calc Af Amer: >60 mL/min (ref >60)
GFR calc non Af Amer: >60 mL/min (ref >60)
GLUCOSE: 140 mg/dL — AB (ref 70–99)
POTASSIUM: 3 mmol/L — AB (ref 3.5–5.1)
SODIUM: 140 mmol/L (ref 135–145)
TOTAL PROTEIN: 6.6 g/dL (ref 6.5–8.1)
Total Bilirubin: 0.8 mg/dL (ref 0.3–1.2)

## 2018-03-18 LAB — BLOOD GAS, VENOUS
Acid-Base Excess: 6.4 mmol/L — ABNORMAL HIGH (ref 0.0–2.0)
Bicarbonate: 32.7 mmol/L — ABNORMAL HIGH (ref 20.0–28.0)
O2 SAT: 93.6 %
PATIENT TEMPERATURE: 37
pCO2, Ven: 54 mmHg (ref 44.0–60.0)
pH, Ven: 7.39 (ref 7.250–7.430)
pO2, Ven: 70 mmHg — ABNORMAL HIGH (ref 32.0–45.0)

## 2018-03-18 LAB — TROPONIN I: Troponin I: 0.05 ng/mL (ref <0.03)

## 2018-03-18 MED ORDER — ALBUTEROL SULFATE (2.5 MG/3ML) 0.083% IN NEBU
5.0000 mg | INHALATION_SOLUTION | Freq: Once | RESPIRATORY_TRACT | Status: AC
  Administered 2018-03-18: 5 mg via RESPIRATORY_TRACT
  Filled 2018-03-18: qty 6

## 2018-03-18 MED ORDER — CEFTRIAXONE SODIUM 1 G IJ SOLR
1.0000 g | Freq: Once | INTRAMUSCULAR | Status: AC
  Administered 2018-03-18: 1 g via INTRAVENOUS
  Filled 2018-03-18: qty 10

## 2018-03-18 MED ORDER — ALBUTEROL SULFATE (2.5 MG/3ML) 0.083% IN NEBU
5.0000 mg | INHALATION_SOLUTION | Freq: Once | RESPIRATORY_TRACT | Status: AC
  Administered 2018-03-18: 5 mg via RESPIRATORY_TRACT

## 2018-03-18 MED ORDER — IPRATROPIUM-ALBUTEROL 0.5-2.5 (3) MG/3ML IN SOLN
3.0000 mL | Freq: Once | RESPIRATORY_TRACT | Status: AC
  Administered 2018-03-18: 3 mL via RESPIRATORY_TRACT
  Filled 2018-03-18: qty 3

## 2018-03-18 MED ORDER — SODIUM CHLORIDE 0.9 % IV SOLN
500.0000 mg | Freq: Once | INTRAVENOUS | Status: AC
  Administered 2018-03-18: 500 mg via INTRAVENOUS
  Filled 2018-03-18: qty 500

## 2018-03-18 MED ORDER — ALBUTEROL SULFATE (2.5 MG/3ML) 0.083% IN NEBU
INHALATION_SOLUTION | RESPIRATORY_TRACT | Status: AC
  Administered 2018-03-18: 5 mg via RESPIRATORY_TRACT
  Filled 2018-03-18: qty 6

## 2018-03-18 NOTE — ED Notes (Signed)
Pt in bed with the head of the bed laid back sleeping. Lights are dimmed. No Resp distress noted. WOB remains at the same level that pt reported is "normal" for him.

## 2018-03-18 NOTE — ED Notes (Signed)
RN informed respiratory of VBG sent to lab.

## 2018-03-18 NOTE — ED Provider Notes (Addendum)
[facility] Emergency Department Provider Note  ____________________________________________  Time seen: Approximately 3:13 PM  I have reviewed the triage vital signs and the nursing notes.   HISTORY  Chief Complaint Respiratory Distress    HPI Johnny Cisneros is a 84 y.o. male with a history of COPD currently under hospice care for end-stage COPD who had worsening shortness of breath today.  Rapid onset, severe, no aggravating or alleviating factors.  Constant.  On EMS arrival he was found to have 79% saturation on his usual 4 L nasal cannula.   Denies chest pain fevers chills sweats.  He does have increased cough which is nonproductive.  EMS put the patient on nonrebreather which increased oxygen saturation into the 90s along with giving him 125 mg of Solu-Medrol, 1 g of magnesium sulfate IV, 2 DuoNeb's.   Past Medical History:  Diagnosis Date  . COPD (chronic obstructive pulmonary disease) [facility])      Patient Active Problem List   Diagnosis Date Noted  . Acute on chronic respiratory failure (HCC) 04/03/2017     History reviewed. No pertinent surgical history.   Prior to Admission medications   Medication Sig Start Date End Date Taking? Authorizing Provider  albuterol (PROVENTIL) (2.5 MG/3ML) 0.083% nebulizer solution Take 2.5 mg by nebulization every 6 (six) hours as needed for wheezing or shortness of breath.   Yes [provider]  atropine 1 % ophthalmic solution Place 3 drops under the tongue every 2 (two) hours as needed (secretions).   Yes [provider]  feeding supplement, ENSURE ENLIVE, (ENSURE ENLIVE) LIQD Take 237 mLs by mouth 2 (two) times daily between meals. 04/07/17  Yes Enedina Finner, MD  finasteride (PROSCAR) 5 MG tablet Take 5 mg by mouth daily.   Yes [provider]  FLUoxetine (PROZAC) 40 MG capsule Take 40 mg by mouth daily.   Yes [provider]  LORazepam (ATIVAN) 0.5 MG tablet Take 0.25 mg by  mouth every 4 (four) hours as needed for anxiety (reevaluate after 03/29/2018). 03/16/18  Yes [provider]  mometasone (ASMANEX) 220 MCG/INH inhaler Inhale 2 puffs into the lungs at bedtime.   Yes [provider]  morphine 20 MG/5ML solution Take 5 mg by mouth every 4 (four) hours. Additionally: take 10MG  by mouth every hour as needed for shortness of breath, pain and/or dyspnea   Yes [provider]  Mouthwashes (BIOTENE/CALCIUM PBF) LIQD Use as directed 15 mLs in the mouth or throat 2 (two) times daily.   Yes [provider]  omeprazole (PRILOSEC) 20 MG capsule Take 20 mg by mouth daily.   Yes [provider]  polyethylene glycol (MIRALAX / GLYCOLAX) packet Take 17 g by mouth daily.   Yes [provider]  predniSONE (DELTASONE) 10 MG tablet Take 30 mg by mouth every evening. 03/16/18 03/19/18 Yes [provider]  primidone (MYSOLINE) 50 MG tablet Take 50 mg by mouth 2 (two) times daily.   Yes [provider]  sennosides-docusate sodium (SENOKOT-S) 8.6-50 MG tablet Take 1 tablet by mouth 2 (two) times daily.   Yes [provider]  terazosin (HYTRIN) 10 MG capsule Take 10 mg by mouth at bedtime.   Yes [provider]  theophylline (THEO-24) 300 MG 24 hr capsule Take 150 mg by mouth 2 (two) times daily.   Yes [provider]  Tiotropium Bromide-Olodaterol (STIOLTO RESPIMAT IN) Inhale 2 puffs into the lungs daily.   Yes [provider]  vitamin B-12 (CYANOCOBALAMIN) 1000  MCG tablet Take 1,000 mcg by mouth daily.   Yes [provider]     Allergies Patient has no known allergies.   History reviewed. No pertinent family history.  Social History Social History   Tobacco Use  . Smoking status: Never Smoker  . Smokeless tobacco: Never Used  Substance Use Topics  . Alcohol use: No  . Drug use: No    Review of Systems  Constitutional:   No fever or chills.  ENT:   No sore  throat. No rhinorrhea. Cardiovascular:   No chest pain or syncope. Respiratory: Positive shortness of breath and  cough. Gastrointestinal:   Negative for abdominal pain, vomiting and diarrhea.  Musculoskeletal:   Negative for focal pain or swelling All other systems reviewed and are negative except as documented above in ROS and HPI.  ____________________________________________   PHYSICAL EXAM:  VITAL SIGNS: ED Triage Vitals  Enc Vitals Group     BP 03/18/18 1154 128/84     Pulse Rate 03/18/18 1154 (!) 50     Resp 03/18/18 1154 (!) 44     Temp 03/18/18 1154 98.4 F (36.9 C)     Temp Source 03/18/18 1154 Oral     SpO2 03/18/18 1154 99 %     Weight 03/18/18 1155 138 lb (62.6 kg)     Height --      Head Circumference --      Peak Flow --      Pain Score 03/18/18 1155 0     Pain Loc --      Pain Edu? --      Excl. in GC? --     Vital signs reviewed, nursing assessments reviewed.   Constitutional:   Alert and oriented.  Ill-appearing, respiratory distress. Eyes:   Conjunctivae are normal. EOMI. PERRL. ENT      Head:   Normocephalic and atraumatic.      Nose:   No congestion/rhinnorhea.       Mouth/Throat:   MMM, no pharyngeal erythema. No peritonsillar mass.       Neck:   No meningismus. Full ROM. Hematological/Lymphatic/Immunilogical:   No cervical lymphadenopathy. Cardiovascular:   Tachycardia heart rate 120. Symmetric bilateral radial and DP pulses.  No murmurs.  Respiratory:   Tachypnea, prolonged expiratory phase, diffuse expiratory wheezing.  Symmetric air entry. Gastrointestinal:   Soft and nontender. Non distended. There is no CVA tenderness.  No rebound, rigidity, or guarding.  Musculoskeletal:   Normal range of motion in all extremities. No joint effusions.  No lower extremity tenderness.  No edema. Neurologic:   Normal speech and language.  Motor grossly intact. No acute focal neurologic deficits are appreciated.  Skin:    Skin is warm, dry and intact. No  rash noted.  No petechiae, purpura, or bullae.  ____________________________________________    LABS (pertinent positives/negatives) (all labs ordered are listed, but only abnormal results are displayed) Labs Reviewed  COMPREHENSIVE METABOLIC PANEL - Abnormal; Notable for the following components:      Result Value   Potassium 3.0 (*)    Glucose, Bld 140 (*)    Calcium 8.8 (*)    Albumin 2.9 (*)    All other components within normal limits  TROPONIN I - Abnormal; Notable for the following components:   Troponin I 0.05 (*)    All other components within normal limits  CBC WITH DIFFERENTIAL/PLATELET - Abnormal; Notable for the following components:   WBC 11.9 (*)    RBC 3.89 (*)  Hemoglobin 11.8 (*)    HCT 34.9 (*)    Neutro Abs 9.4 (*)    All other components within normal limits  BLOOD GAS, VENOUS - Abnormal; Notable for the following components:   pO2, Ven 70.0 (*)    Bicarbonate 32.7 (*)    Acid-Base Excess 6.4 (*)    All other components within normal limits   ____________________________________________   EKG  Operative by me Sinus tachycardia rate 102, normal axis intervals QRS ST segments and T waves.  ____________________________________________    RADIOLOGY  Dg Chest Portable 1 View  Result Date: 03/18/2018 CLINICAL DATA:  Respiratory distress.  COPD. EXAM: PORTABLE CHEST 1 VIEW COMPARISON:  04/06/2017 FINDINGS: Cardiac silhouette is normal in size. No mediastinal or hilar masses. No convincing adenopathy. Prominent main pulmonary arteries bilaterally, stable. Lungs are hyperexpanded. There are irregular interstitial type opacities at the lung bases, right greater than left, similar to the prior exam. No evidence of pneumonia or pulmonary edema. No pleural effusion or pneumothorax. Skeletal structures are grossly intact. IMPRESSION: 1. No acute cardiopulmonary disease. 2. COPD. Interstitial opacities at the lung bases suggests interstitial lung disease, similar  to the prior exam. Electronically Signed   By: Amie Portland M.D.   On: 03/18/2018 12:13    ____________________________________________   PROCEDURES .Critical Care Performed by: Sharman Cheek, MD Authorized by: Sharman Cheek, MD   Critical care provider statement:    Critical care time (minutes):  35   Critical care time was exclusive of:  Separately billable procedures and treating other patients   Critical care was necessary to treat or prevent imminent or life-threatening deterioration of the following conditions:  Respiratory failure   Critical care was time spent personally by me on the following activities:  Development of treatment plan with patient or surrogate, discussions with consultants, evaluation of patient's response to treatment, examination of patient, obtaining history from patient or surrogate, ordering and performing treatments and interventions, ordering and review of laboratory studies, ordering and review of radiographic studies, pulse oximetry, re-evaluation of patient's condition and review of old charts    ____________________________________________  DIFFERENTIAL DIAGNOSIS   COPD exacerbation, pneumonia, pneumothorax.  Low suspicion of pulmonary embolism non-STEMI dissection or myocarditis  CLINICAL IMPRESSION / ASSESSMENT AND PLAN / ED COURSE  Pertinent labs & imaging results that were available during my care of the patient were reviewed by me and considered in my medical decision making (see chart for details).    Presents with respiratory distress.  Despite broad treatment by EMS on route, patient is still with significantly elevated work of breathing, prolonged expiratory phase and wheezing.  Therefore BiPAP was initiated to continue treatment while also decreasing work of breathing and preventing respiratory fatigue.  Chest x-ray was unremarkable, does not show a lobar pneumonia.  Labs unremarkable.    On reassessment at 3:00 PM, patient  reports that he is feeling better, still significantly wheezy with prolonged expiratory phase, oxygen saturation 91% on his usual 4 L nasal cannula.  Recommended hospitalization due to the severity of his underlying COPD and severe hypoxic respiratory failure today.  Patient is agreeable on worried that he still not improved enough to manage outpatient.  Patient receives his care through the [facility] medical system.  Will attempt to arrange transfer.   ----------------------------------------- 6:33 PM on 03/18/2018 -----------------------------------------  [facility]      ____________________________________________   FINAL CLINICAL IMPRESSION(S) / ED DIAGNOSES    Final diagnoses:  COPD exacerbation (  HCC)  Acute on chronic respiratory failure with hypoxia [facility](HCC)     ED Discharge Orders    None      Portions of this note were generated with dragon dictation software. Dictation errors may occur despite best attempts at proofreading.    Sharman CheekStafford, Sami Roes, MD 03/18/18 1551    Sharman CheekStafford, Ndeye Tenorio, MD 03/18/18 [phone]

## 2018-03-18 NOTE — ED Notes (Signed)
Mask removed while pt takes a drink of water. Pt continues to have a decreased WOB. PT able to talk to RN but still unable to talk in complete sentences.

## 2018-03-18 NOTE — ED Notes (Signed)
PT resting in bed with Bipap on. Pt has a noted decreased WOB and is able to lay at a 45 degree angle without SOB. Pt reports :feelign better". Wheezing has decreased bilaterally.

## 2018-03-18 NOTE — ED Notes (Signed)
Pt signed hard copy of consent to transport.

## 2018-03-18 NOTE — ED Triage Notes (Signed)
[facility] with reports of respiratory distress that started this afternoon. Pt has a hx of end stage COPD and is on 2L oxygen chronically. PT has wheezing in all fields upon arrival but reports feeling less SOB after EMS intervention. Pt is able to speak but talks in choppy one word phrases. EMS reported pt was 79% on 4L.   EMS placed pt on  NRB at 15L O2, 2 duo nebs, 125 Soul medrol and 2mg  Mag via pts IV.

## 2018-03-18 NOTE — ED Notes (Signed)
Pt continues to rest in bed. Pt sleeping at this time. NAD Noted. Bed locked and in lowest position.

## 2018-11-13 IMAGING — DX DG CHEST 1V PORT
1 series · 2 of 2 positions shown · non-contrast
Comparison: 04/03/2017

CLINICAL DATA: Acute respiratory failure

EXAM:
PORTABLE CHEST 1 VIEW

[Series 1: chest ap · 0.14mm/px · 2 of 2 slices shown]
[im 1/2]
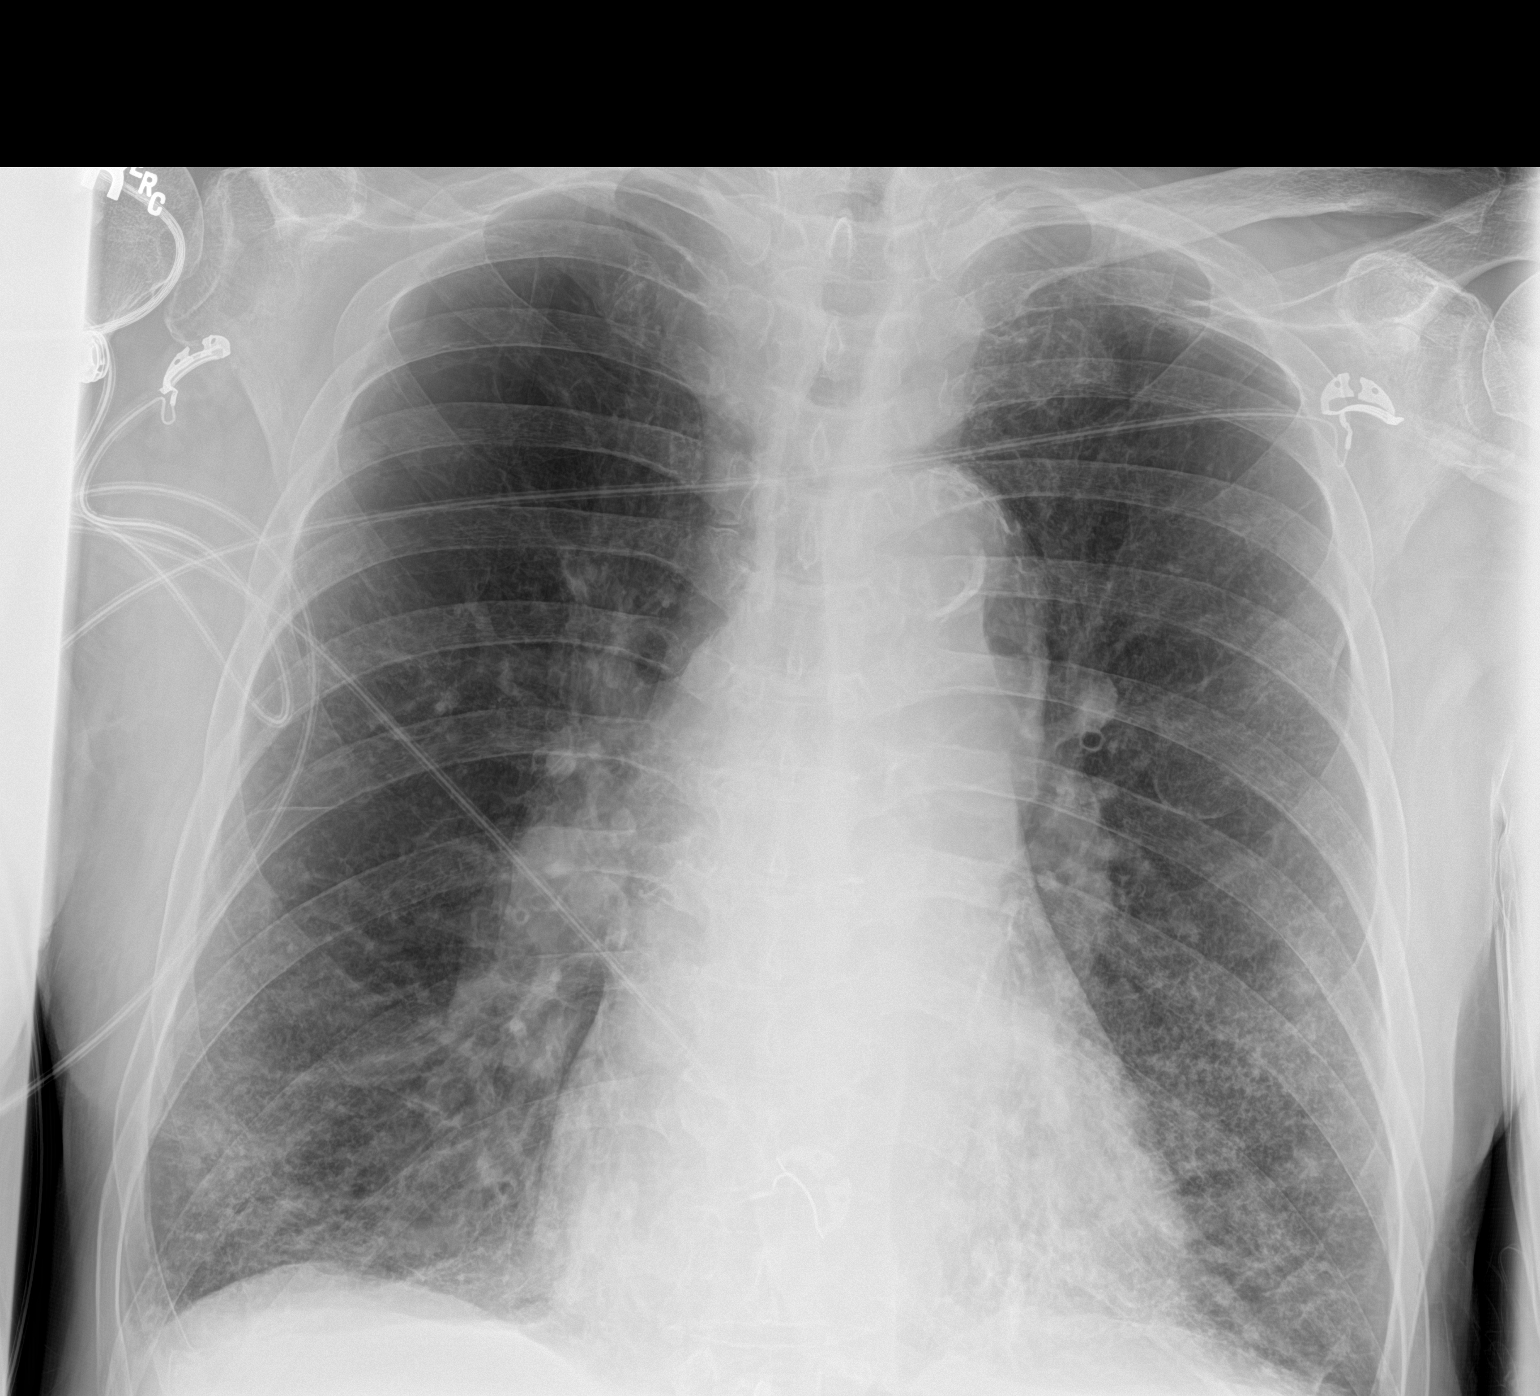
[im 2/2]
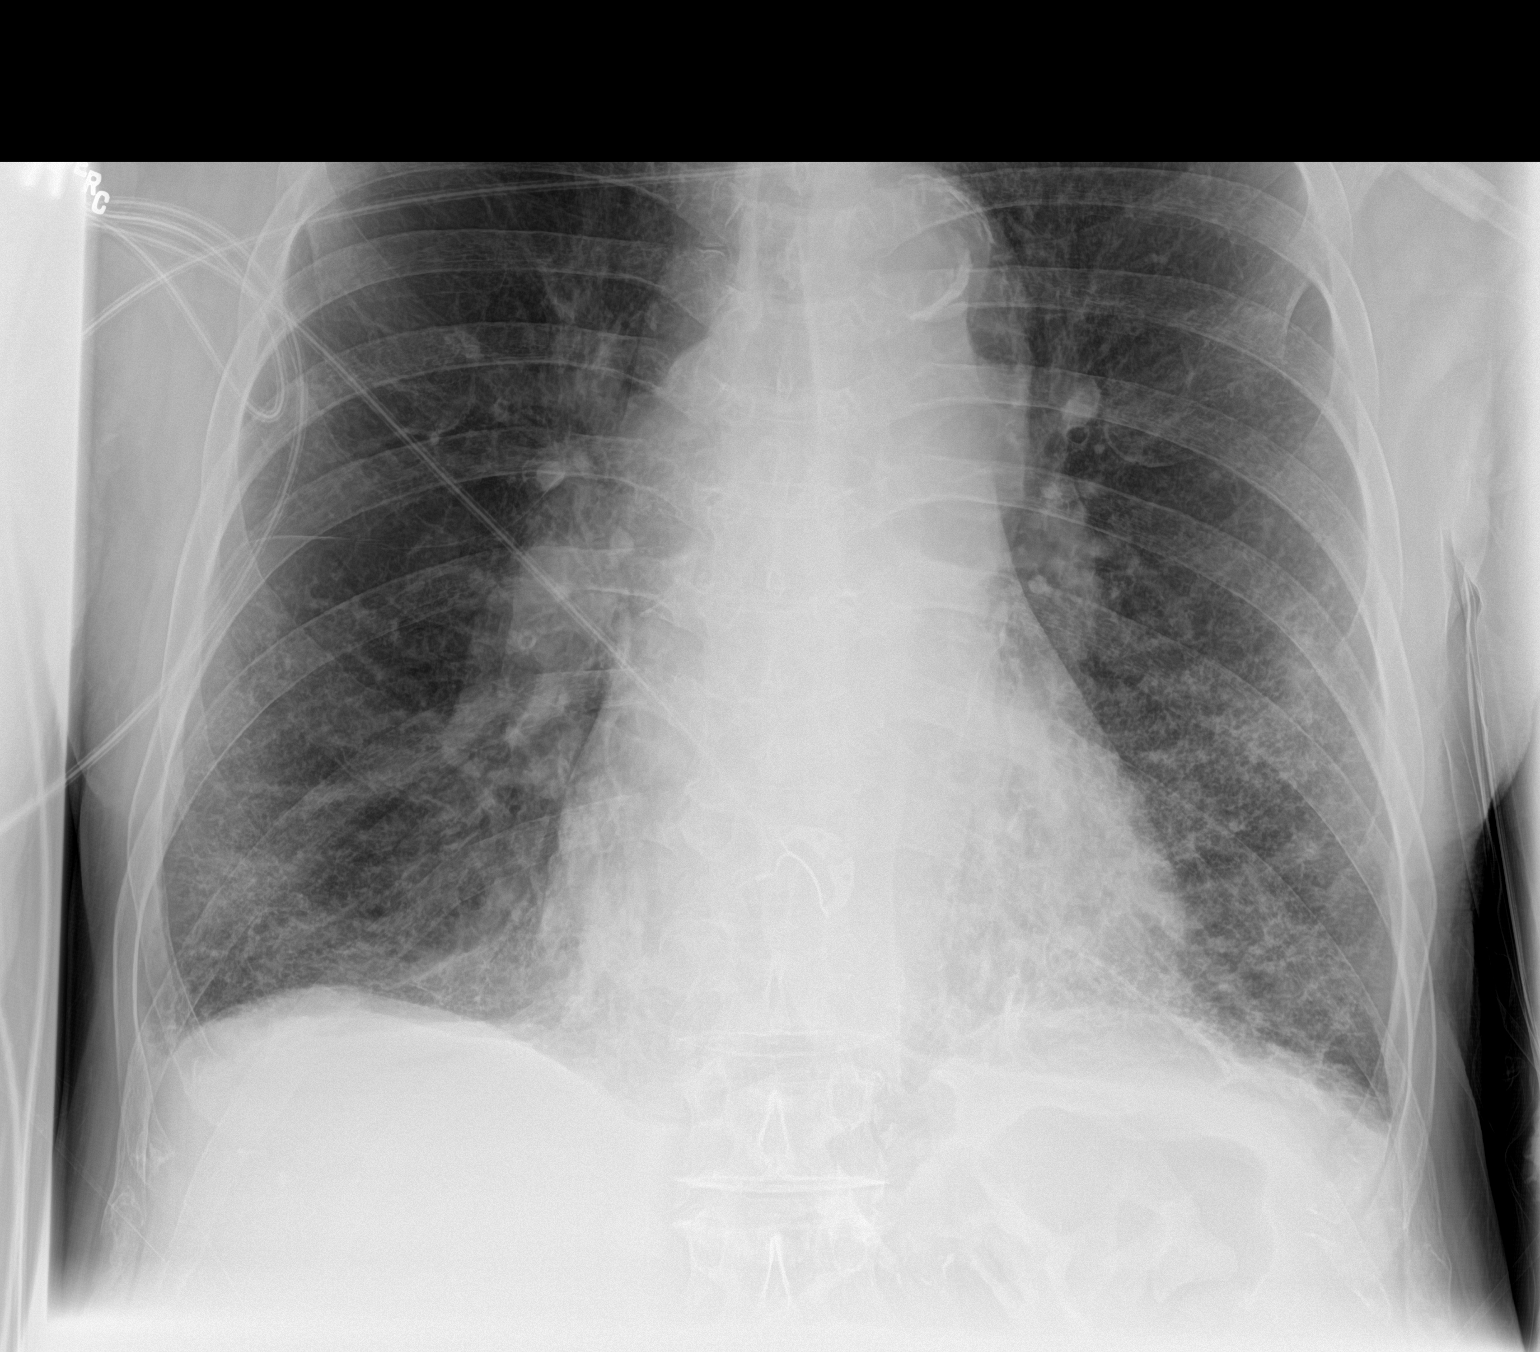

[2 of 2 positions shown; findings below may reference images not displayed]

FINDINGS: COPD without hyperinflation. Coarse lung markings in the bases
bilaterally. Progression of airspace disease in both lung bases
suggesting superimposed atelectasis or pneumonia. Possible
underlying scarring in the bases also present. Negative for heart
failure or effusion. Atherosclerotic aorta
IMPRESSION: COPD. Coarse lung markings in the bases have progressed suggesting
superimposed pneumonia.

## 2018-12-15 DEATH — deceased

## 2019-10-27 IMAGING — DX DG CHEST 1V PORT
1 series · 1 of 1 positions shown · non-contrast
Comparison: 04/06/2017

CLINICAL DATA: Respiratory distress.  COPD.

EXAM:
PORTABLE CHEST 1 VIEW

[chest ap]
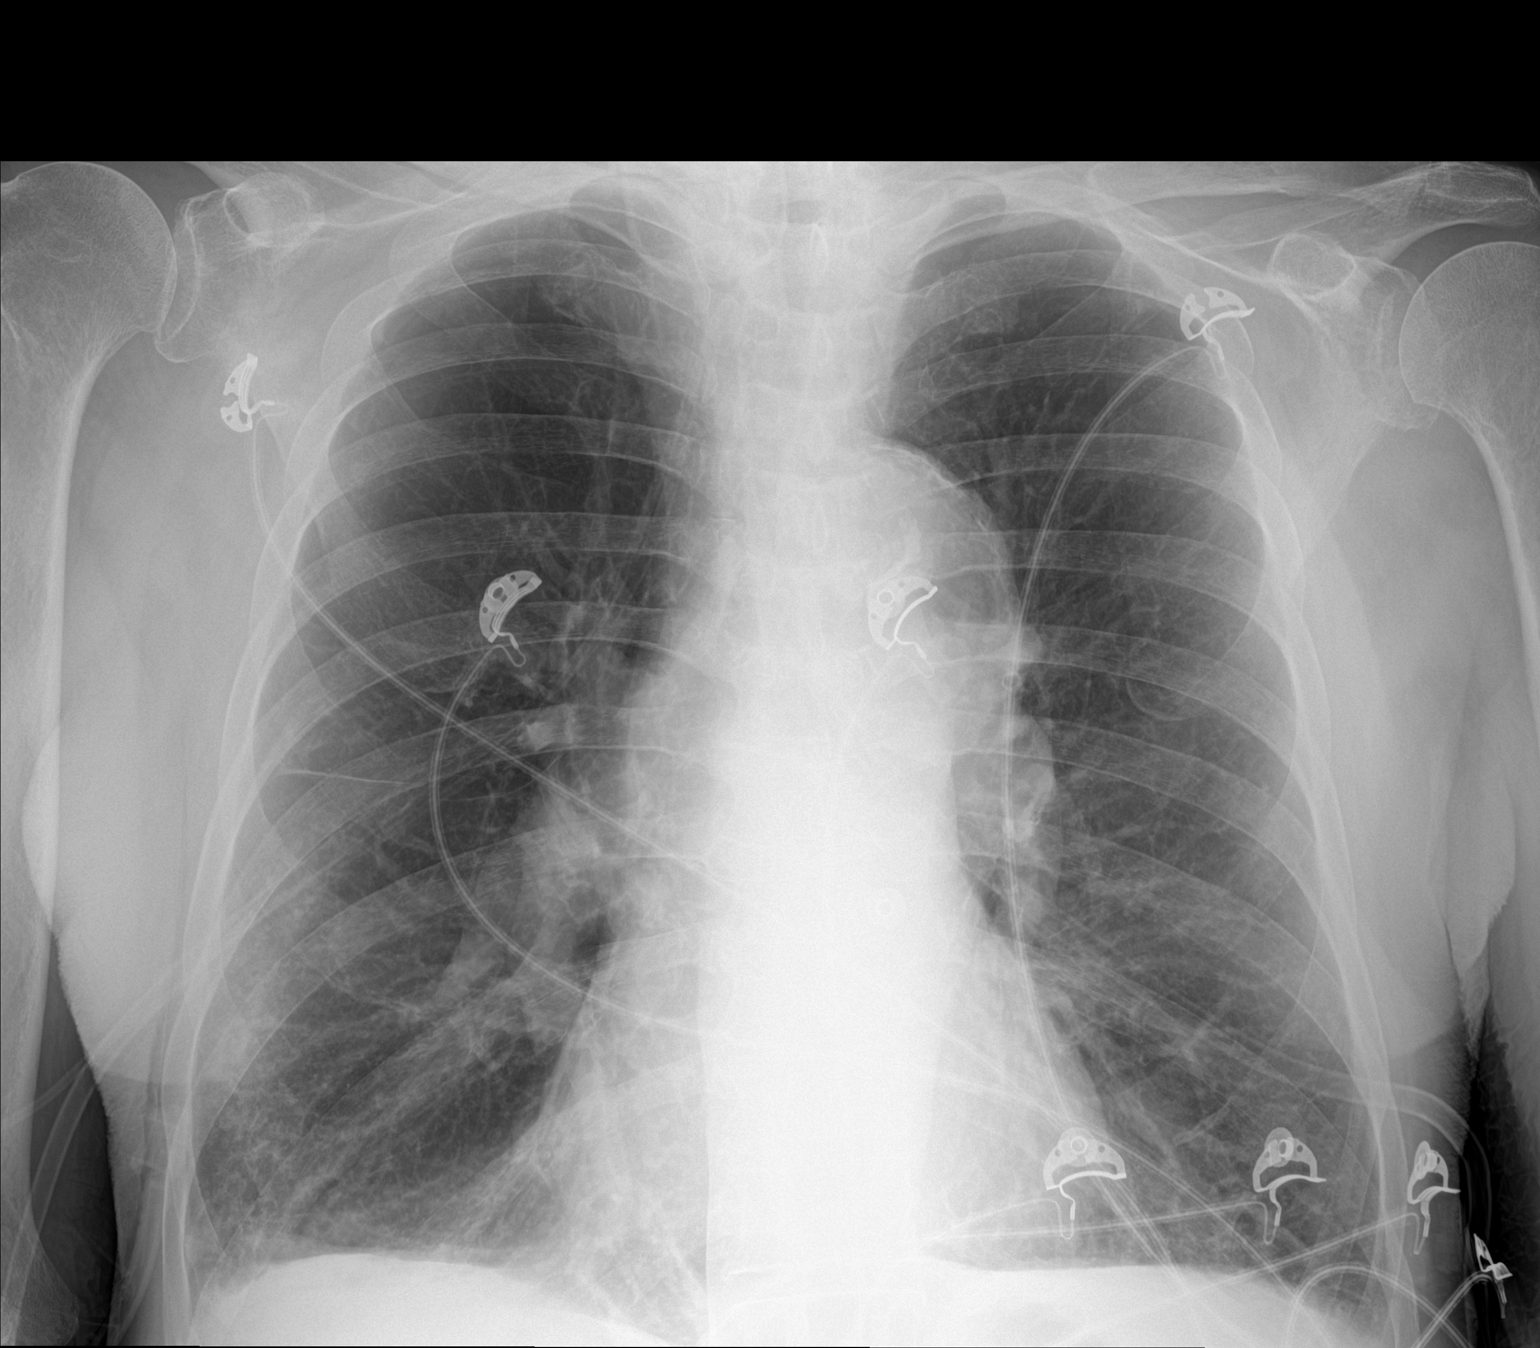

[1 of 1 positions shown; findings below may reference images not displayed]

FINDINGS: Cardiac silhouette is normal in size. No mediastinal or hilar
masses. No convincing adenopathy. Prominent main pulmonary arteries
bilaterally, stable.

Lungs are hyperexpanded. There are irregular interstitial type
opacities at the lung bases, right greater than left, similar to the
prior exam. No evidence of pneumonia or pulmonary edema.

No pleural effusion or pneumothorax.

Skeletal structures are grossly intact.
IMPRESSION: 1. No acute cardiopulmonary disease.
2. COPD. Interstitial opacities at the lung bases suggests
interstitial lung disease, similar to the prior exam.
